# Patient Record
Sex: Female | Born: 1955 | Race: White | Hispanic: No | Marital: Married | Smoking: Never smoker
Health system: Southern US, Community
[De-identification: ages and names within clinical notes are randomized; demographics above are authoritative.]

## PROBLEM LIST (undated history)

## (undated) DIAGNOSIS — M779 Enthesopathy, unspecified: Secondary | ICD-10-CM

## (undated) DIAGNOSIS — G43909 Migraine, unspecified, not intractable, without status migrainosus: Secondary | ICD-10-CM

## (undated) DIAGNOSIS — R42 Dizziness and giddiness: Secondary | ICD-10-CM

## (undated) DIAGNOSIS — D219 Benign neoplasm of connective and other soft tissue, unspecified: Secondary | ICD-10-CM

## (undated) HISTORY — DX: Dizziness and giddiness: R42

## (undated) HISTORY — DX: Benign neoplasm of connective and other soft tissue, unspecified: D21.9

## (undated) HISTORY — DX: Enthesopathy, unspecified: M77.9

## (undated) HISTORY — DX: Migraine, unspecified, not intractable, without status migrainosus: G43.909

## (undated) HISTORY — PX: HYSTEROSCOPY: SHX211

---
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
## 1997-05-26 ENCOUNTER — Other Ambulatory Visit: Admission: RE | Admit: 1997-05-26 | Discharge: 1997-05-26 | Payer: Self-pay | Admitting: Obstetrics and Gynecology

## 1997-09-01 ENCOUNTER — Other Ambulatory Visit: Admission: RE | Admit: 1997-09-01 | Discharge: 1997-09-01 | Payer: Self-pay | Admitting: Obstetrics and Gynecology

## 1997-12-15 ENCOUNTER — Other Ambulatory Visit: Admission: RE | Admit: 1997-12-15 | Discharge: 1997-12-15 | Payer: Self-pay | Admitting: Obstetrics and Gynecology

## 1998-04-09 ENCOUNTER — Other Ambulatory Visit: Admission: RE | Admit: 1998-04-09 | Discharge: 1998-04-09 | Payer: Self-pay | Admitting: Obstetrics and Gynecology

## 1998-10-08 ENCOUNTER — Ambulatory Visit (HOSPITAL_COMMUNITY): Admission: RE | Admit: 1998-10-08 | Discharge: 1998-10-08 | Payer: Self-pay | Admitting: Obstetrics and Gynecology

## 1998-10-08 ENCOUNTER — Encounter: Payer: Self-pay | Admitting: Obstetrics and Gynecology

## 1999-02-25 ENCOUNTER — Ambulatory Visit (HOSPITAL_COMMUNITY): Admission: RE | Admit: 1999-02-25 | Discharge: 1999-02-25 | Payer: Self-pay | Admitting: Obstetrics and Gynecology

## 1999-02-25 ENCOUNTER — Encounter (INDEPENDENT_AMBULATORY_CARE_PROVIDER_SITE_OTHER): Payer: Self-pay | Admitting: Specialist

## 1999-05-18 ENCOUNTER — Other Ambulatory Visit: Admission: RE | Admit: 1999-05-18 | Discharge: 1999-05-18 | Payer: Self-pay | Admitting: Obstetrics and Gynecology

## 1999-12-09 ENCOUNTER — Ambulatory Visit (HOSPITAL_COMMUNITY): Admission: RE | Admit: 1999-12-09 | Discharge: 1999-12-09 | Payer: Self-pay | Admitting: Obstetrics and Gynecology

## 1999-12-09 ENCOUNTER — Encounter: Payer: Self-pay | Admitting: Obstetrics and Gynecology

## 2000-05-23 ENCOUNTER — Other Ambulatory Visit: Admission: RE | Admit: 2000-05-23 | Discharge: 2000-05-23 | Payer: Self-pay | Admitting: Obstetrics and Gynecology

## 2001-05-23 ENCOUNTER — Other Ambulatory Visit: Admission: RE | Admit: 2001-05-23 | Discharge: 2001-05-23 | Payer: Self-pay | Admitting: Obstetrics and Gynecology

## 2002-04-30 ENCOUNTER — Ambulatory Visit (HOSPITAL_COMMUNITY): Admission: RE | Admit: 2002-04-30 | Discharge: 2002-04-30 | Payer: Self-pay | Admitting: Obstetrics and Gynecology

## 2002-04-30 ENCOUNTER — Encounter: Payer: Self-pay | Admitting: Obstetrics and Gynecology

## 2002-06-03 ENCOUNTER — Other Ambulatory Visit: Admission: RE | Admit: 2002-06-03 | Discharge: 2002-06-03 | Payer: Self-pay | Admitting: Obstetrics and Gynecology

## 2003-02-15 HISTORY — PX: ENDOMETRIAL ABLATION: SHX621

## 2003-05-02 ENCOUNTER — Ambulatory Visit (HOSPITAL_COMMUNITY): Admission: RE | Admit: 2003-05-02 | Discharge: 2003-05-02 | Payer: Self-pay | Admitting: Obstetrics and Gynecology

## 2003-06-04 ENCOUNTER — Other Ambulatory Visit: Admission: RE | Admit: 2003-06-04 | Discharge: 2003-06-04 | Payer: Self-pay | Admitting: Obstetrics and Gynecology

## 2003-08-05 ENCOUNTER — Ambulatory Visit (HOSPITAL_COMMUNITY): Admission: RE | Admit: 2003-08-05 | Discharge: 2003-08-05 | Payer: Self-pay | Admitting: Obstetrics and Gynecology

## 2003-08-05 ENCOUNTER — Ambulatory Visit (HOSPITAL_BASED_OUTPATIENT_CLINIC_OR_DEPARTMENT_OTHER): Admission: RE | Admit: 2003-08-05 | Discharge: 2003-08-05 | Payer: Self-pay | Admitting: Obstetrics and Gynecology

## 2003-08-05 ENCOUNTER — Encounter (INDEPENDENT_AMBULATORY_CARE_PROVIDER_SITE_OTHER): Payer: Self-pay | Admitting: Specialist

## 2004-05-05 ENCOUNTER — Ambulatory Visit (HOSPITAL_COMMUNITY): Admission: RE | Admit: 2004-05-05 | Discharge: 2004-05-05 | Payer: Self-pay | Admitting: Obstetrics and Gynecology

## 2004-06-08 ENCOUNTER — Other Ambulatory Visit: Admission: RE | Admit: 2004-06-08 | Discharge: 2004-06-08 | Payer: Self-pay | Admitting: Obstetrics and Gynecology

## 2005-05-06 ENCOUNTER — Ambulatory Visit (HOSPITAL_COMMUNITY): Admission: RE | Admit: 2005-05-06 | Discharge: 2005-05-06 | Payer: Self-pay | Admitting: Obstetrics and Gynecology

## 2005-06-09 ENCOUNTER — Other Ambulatory Visit: Admission: RE | Admit: 2005-06-09 | Discharge: 2005-06-09 | Payer: Self-pay | Admitting: Obstetrics and Gynecology

## 2006-02-24 ENCOUNTER — Encounter: Admission: RE | Admit: 2006-02-24 | Discharge: 2006-02-24 | Payer: Self-pay | Admitting: Obstetrics and Gynecology

## 2006-05-09 ENCOUNTER — Encounter: Admission: RE | Admit: 2006-05-09 | Discharge: 2006-05-09 | Payer: Self-pay | Admitting: Obstetrics and Gynecology

## 2006-07-14 ENCOUNTER — Other Ambulatory Visit: Admission: RE | Admit: 2006-07-14 | Discharge: 2006-07-14 | Payer: Self-pay | Admitting: Obstetrics and Gynecology

## 2007-01-04 ENCOUNTER — Encounter: Admission: RE | Admit: 2007-01-04 | Discharge: 2007-01-15 | Payer: Self-pay | Admitting: Neurology

## 2007-05-11 ENCOUNTER — Encounter: Admission: RE | Admit: 2007-05-11 | Discharge: 2007-05-11 | Payer: Self-pay | Admitting: Obstetrics and Gynecology

## 2007-08-24 ENCOUNTER — Other Ambulatory Visit: Admission: RE | Admit: 2007-08-24 | Discharge: 2007-08-24 | Payer: Self-pay | Admitting: Obstetrics and Gynecology

## 2008-06-16 ENCOUNTER — Ambulatory Visit (HOSPITAL_COMMUNITY): Admission: RE | Admit: 2008-06-16 | Discharge: 2008-06-16 | Payer: Self-pay | Admitting: Obstetrics and Gynecology

## 2008-06-16 ENCOUNTER — Ambulatory Visit: Payer: Self-pay | Admitting: Obstetrics and Gynecology

## 2008-09-29 ENCOUNTER — Encounter: Payer: Self-pay | Admitting: Obstetrics and Gynecology

## 2008-09-29 ENCOUNTER — Ambulatory Visit: Payer: Self-pay | Admitting: Obstetrics and Gynecology

## 2008-09-29 ENCOUNTER — Other Ambulatory Visit: Admission: RE | Admit: 2008-09-29 | Discharge: 2008-09-29 | Payer: Self-pay | Admitting: Obstetrics and Gynecology

## 2009-09-14 ENCOUNTER — Encounter (INDEPENDENT_AMBULATORY_CARE_PROVIDER_SITE_OTHER): Payer: Self-pay | Admitting: *Deleted

## 2009-09-14 ENCOUNTER — Ambulatory Visit (HOSPITAL_COMMUNITY): Admission: RE | Admit: 2009-09-14 | Discharge: 2009-09-14 | Payer: Self-pay | Admitting: Obstetrics and Gynecology

## 2009-10-07 ENCOUNTER — Encounter (INDEPENDENT_AMBULATORY_CARE_PROVIDER_SITE_OTHER): Payer: Self-pay | Admitting: *Deleted

## 2009-10-12 ENCOUNTER — Ambulatory Visit: Payer: Self-pay | Admitting: Gastroenterology

## 2009-10-12 ENCOUNTER — Ambulatory Visit: Payer: Self-pay | Admitting: Obstetrics and Gynecology

## 2009-10-12 ENCOUNTER — Other Ambulatory Visit: Admission: RE | Admit: 2009-10-12 | Discharge: 2009-10-12 | Payer: Self-pay | Admitting: Obstetrics and Gynecology

## 2010-03-16 NOTE — Letter (Signed)
Summary: [facility] Instructions  [facility]  [address] [place], [place] [address]   Phone: [phone]  Fax: [phone]       Dorothy Sharp    [birth year]    MRN: [id number]        Procedure Day [name]:  Duanne Limerick  11/02/09     Arrival Time:  8:00AM     Procedure Time:  9:00AM     Location of Procedure:                    _ X_  [facility] (4th Floor)  PREPARATION FOR COLONOSCOPY WITH MOVIPREP   Starting 5 days prior to your procedure 10/28/09 do not eat nuts, seeds, popcorn, corn, beans, peas,  salads, or any raw vegetables.  Do not take any fiber supplements (e.g. Metamucil, Citrucel, and Benefiber).  THE DAY BEFORE YOUR PROCEDURE         DATE: 11/01/09  DAY: SUNDAY  1.  Drink clear liquids the entire day-NO SOLID FOOD  2.  Do not drink anything colored red or purple.  Avoid juices with pulp.  No orange juice.  3.  Drink at least 64 oz. (8 glasses) of fluid/clear liquids during the day to prevent dehydration and help the prep work efficiently.  CLEAR LIQUIDS INCLUDE: Water Jello Ice Popsicles Tea (sugar ok, no milk/cream) Powdered fruit flavored drinks Coffee (sugar ok, no milk/cream) Gatorade Juice: apple, white grape, white cranberry  Lemonade Clear bullion, consomm, broth Carbonated beverages (any kind) Strained chicken noodle soup Hard Candy                             4.  In the morning, mix first dose of MoviPrep solution:    Empty 1 Pouch A and 1 Pouch B into the disposable container    Add lukewarm drinking water to the top line of the container. Mix to dissolve    Refrigerate (mixed solution should be used within 24 hrs)  5.  Begin drinking the prep at 5:00 p.m. The MoviPrep container is divided by 4 marks.   Every 15 minutes drink the solution down to the next mark (approximately 8 oz) until the full liter is complete.   6.  Follow completed prep with 16 oz of clear liquid of your choice (Nothing red or purple).   Continue to drink clear liquids until bedtime.  7.  Before going to bed, mix second dose of MoviPrep solution:    Empty 1 Pouch A and 1 Pouch B into the disposable container    Add lukewarm drinking water to the top line of the container. Mix to dissolve    Refrigerate  THE DAY OF YOUR PROCEDURE      DATE: 11/02/09  DAY: MONDAY  Beginning at 4:00AM (5 hours before procedure):         1. Every 15 minutes, drink the solution down to the next mark (approx 8 oz) until the full liter is complete.  2. Follow completed prep with 16 oz. of clear liquid of your choice.    3. You may drink clear liquids until 7:00AM (2 HOURS BEFORE PROCEDURE).   MEDICATION INSTRUCTIONS  Unless otherwise instructed, you should take regular prescription medications with a small sip of water   as early as possible the morning of your procedure.       OTHER INSTRUCTIONS  You will need a responsible adult at least 53 years  of age to accompany you and drive you home.   This person must remain in the waiting room during your procedure.  Wear loose fitting clothing that is easily removed.  Leave jewelry and other valuables at home.  However, you may wish to bring a book to read or  an iPod/MP3 player to listen to music as you wait for your procedure to start.  Remove all body piercing jewelry and leave at home.  Total time from sign-in until discharge is approximately 2-3 hours.  You should go home directly after your procedure and rest.  You can resume normal activities the  day after your procedure.  The day of your procedure you should not:   Drive   Make legal decisions   Operate machinery   Drink alcohol   Return to work  You will receive specific instructions about eating, activities and medications before you leave.    The above instructions have been reviewed and explained to me by   Wyona Almas RN  October 12, 2009 4:26 PM     I fully understand and can verbalize these  instructions _____________________________ Date _________

## 2010-03-16 NOTE — Miscellaneous (Signed)
Summary: [facility] Previsit/prep  Clinical Lists Changes  Medications: Added new medication of MOVIPREP 100 GM  SOLR (PEG-KCL-NACL-NASULF-NA ASC-C) As per prep instructions. - Signed Rx of MOVIPREP 100 GM  SOLR (PEG-KCL-NACL-NASULF-NA ASC-C) As per prep instructions.;  #1 x 0;  Signed;  Entered by: Wyona Almas RN;  Authorized by: Mardella Layman MD [facility];  Method used: Electronically to [id number] [address] [place], [place]  [address], Ph: [phone] or [phone], Fax: [phone] Allergies: Added new allergy or adverse reaction of * ZITHROMYCIN Observations: Added new observation of NKA: F (10/12/2009 15:55)    Prescriptions: MOVIPREP 100 GM  SOLR (PEG-KCL-NACL-NASULF-NA ASC-C) As per prep instructions.  #1 x 0   Entered by:   Wyona Almas RN   Authorized by:   Mardella Layman MD [facility]   Signed by:   Wyona Almas RN on 10/12/2009   Method used:   Electronically to        [facility] (retail)       [address]       [place], [place]  [address]       Ph: [phone] or [phone]       Fax: [phone]   RxID:   [id number]

## 2010-03-16 NOTE — Letter (Signed)
Summary: Previsit letter  [facility]  [address] [place], [place] [address]   Phone: [phone]  Fax: [phone]       09/14/2009 MRN: [id number]  Dorothy Sharp [address] [place], [place]  [address]  Dear [name],  Welcome to the Gastroenterology Division at [facility].    You are scheduled to see a nurse for your pre-procedure visit on 10-12-2009 at 4:00pm on the 3rd floor at [facility], [address]  We ask that you try to arrive at our office 15 minutes prior to your appointment time to allow for check-in.  Your nurse visit will consist of discussing your medical and surgical history, your immediate family medical history, and your medications.    Please bring a complete list of all your medications or, if you prefer, bring the medication bottles and we will list them.  We will need to be aware of both prescribed and over the counter drugs.  We will need to know exact dosage information as well.  If you are on blood thinners (Coumadin, Plavix, Aggrenox, Ticlid, etc.) please call our office today/prior to your appointment, as we need to consult with your physician about holding your medication.   Please be prepared to read and sign documents such as consent forms, a financial agreement, and acknowledgement forms.  If necessary, and with your consent, a friend or relative is welcome to sit-in on the nurse visit with you.  Please bring your insurance card so that we may make a copy of it.  If your insurance requires a referral to see a specialist, please bring your referral form from your primary care physician.  No co-pay is required for this nurse visit.     If you cannot keep your appointment, please call [phone] to cancel or reschedule prior to your appointment date.  This allows [place] the opportunity to schedule an appointment for another patient in need of care.    Thank you for choosing [facility] for your medical  needs.  We appreciate the opportunity to care for you.  Please visit [place] at our website  to learn more about our practice.                     Sincerely.                                                                                                                   The Gastroenterology Division

## 2010-07-02 NOTE — Op Note (Signed)
NAME:  Dorothy Sharp, Dorothy Sharp                        ACCOUNT NO.:  [id number]   MEDICAL RECORD NO.:  [id number]                   PATIENT TYPE:  AMB   LOCATION:  [facility]                                 FACILITY:  [facility]   PHYSICIAN:  Daniel L. Eda Paschal, M.D.           DATE OF BIRTH:  [birth year]   DATE OF PROCEDURE:  08/05/2003  DATE OF DISCHARGE:                                 OPERATIVE REPORT   PREOPERATIVE DIAGNOSES:  Menometrorrhagia with submucous myoma.   POSTOPERATIVE DIAGNOSES:  Menometrorrhagia with submucous myoma.   OPERATION:  Hysteroscopic myomectomy followed by endometrial ablation.   SURGEON:  Daniel L. Eda Paschal, M.D.   ANESTHESIA:  General.   INDICATIONS FOR PROCEDURE:  The patient is a 55 year old female who  presented to the office with a six month history of menometrorrhagia.  Saline infusion hysterogram was done in the office which revealed a  submucous myoma of approximately 3 cm. The patient was pretreated with Depot-  Lupron and now enters the hospital for hysteroscopic removal of the myoma  followed by endometrial ablation assuming all goes well with the first part  of the procedure.   FINDINGS:  External and vaginal exam were normal.  Cervix is clean, uterus  is top normal size and shape, adnexa are palpably normal. At the time of  hysteroscopy, the patient had a large myoma coming off the right wall. It  appeared to be a little bit smaller than it had been prior to her Depot-  Lupron.  It did go into the wall but only to a small depth and therefore was  very amenable to resection hysteroscopically.  Other than this, top of the  fundus, tubal ostia, anterior and posterior walls of the fundus, lower  uterine segment, endocervical canal were free of disease.   DESCRIPTION OF PROCEDURE:  After adequate general anesthesia, the patient  was placed in the dorsal lithotomy position, prepped and draped in the usual  sterile manner. She was given 1 g of Cefotan IV.   Her laminaria tent which  had been placed yesterday afternoon was removed, her cervix was well  dilated. It was gently dilated to a 31 Pratt dilator although it required  almost no pressure to do so and then a hysteroscopic resectoscope was  introduced with a single wire loop. Settings were 70 coag, 110 cutting. The  myoma could be completed resected without any really significant bleeding  prior to removing it. The edges of the myoma where the blood supply was were  coagulated to decrease bleeding.  It was necessary to go into the myometrium  to get the remainder of the fibroid but this could be done without too much  difficulty as the myoma started to bulge into the cavity as it was resected.  Once it had been resected, the patient was stable so it was elected to  continue with an endometrial ablation. Initially the endometrium was removed  with a wire  loop 360 degrees around and following that the instrumentation  was switched to a roller ball set at 160 cut, 70 coag pure cut and once  again the roller ball was used to continue the  endometrial ablation. When the procedure was terminated, it appeared that  the entire cavity had been well ablated, fluid deficit was 350 mL, blood  loss was minimal.  The patient tolerated the procedure well and left the  operating room in satisfactory condition.  The patient had pictures taken  for documentation.                                               Daniel L. Eda Paschal, M.D.    Tonette Bihari  D:  08/05/2003  T:  08/05/2003  Job:  [id number]

## 2010-09-20 ENCOUNTER — Other Ambulatory Visit: Payer: Self-pay | Admitting: Obstetrics and Gynecology

## 2010-09-20 DIAGNOSIS — Z1231 Encounter for screening mammogram for malignant neoplasm of breast: Secondary | ICD-10-CM

## 2010-09-21 ENCOUNTER — Telehealth: Payer: Self-pay

## 2010-09-21 NOTE — Telephone Encounter (Signed)
LEFT DETAILED MESSAGE PER DR. GOTTSEGENS NOTE BELOW. TOLD PT TO LEAVE ME A MESSAGE IN MY VOICEMAIL WHAT SHE NEEDS TO DO.

## 2010-09-21 NOTE — Telephone Encounter (Signed)
If she wants to it's fine. However she's had 2 normal ones  the last 2 years I think we could skip this year and lesser diabetes change.

## 2010-09-21 NOTE — Telephone Encounter (Signed)
HAS AEX WITH YOU 10/25/10 A.M. ASKING IF SHE NEEDS TO DO FASTING CHOL. THAT A.M?

## 2010-09-22 NOTE — Telephone Encounter (Signed)
PT. CALLED BACK AND STATES SHE HAS NO NEED FOR FLP THIS YEAR. SHE IS OK WITH DR. GOTTSEGENS RECOMMENDATION BELOW.

## 2010-09-27 ENCOUNTER — Ambulatory Visit (HOSPITAL_COMMUNITY)
Admission: RE | Admit: 2010-09-27 | Discharge: 2010-09-27 | Disposition: A | Discharge status: Home / Self Care | Payer: BC Managed Care – PPO | Source: Ambulatory Visit | Attending: Obstetrics and Gynecology | Admitting: Obstetrics and Gynecology

## 2010-09-27 DIAGNOSIS — Z1231 Encounter for screening mammogram for malignant neoplasm of breast: Secondary | ICD-10-CM | POA: Insufficient documentation

## 2010-10-06 ENCOUNTER — Other Ambulatory Visit: Payer: Self-pay | Admitting: Obstetrics and Gynecology

## 2010-10-14 DIAGNOSIS — D219 Benign neoplasm of connective and other soft tissue, unspecified: Secondary | ICD-10-CM | POA: Insufficient documentation

## 2010-10-14 DIAGNOSIS — G43909 Migraine, unspecified, not intractable, without status migrainosus: Secondary | ICD-10-CM | POA: Insufficient documentation

## 2010-10-23 ENCOUNTER — Other Ambulatory Visit: Payer: Self-pay | Admitting: Obstetrics and Gynecology

## 2010-10-25 ENCOUNTER — Encounter: Payer: Self-pay | Admitting: Obstetrics and Gynecology

## 2010-10-25 ENCOUNTER — Other Ambulatory Visit (HOSPITAL_COMMUNITY)
Admission: RE | Admit: 2010-10-25 | Discharge: 2010-10-25 | Disposition: A | Discharge status: Home / Self Care | Payer: BC Managed Care – PPO | Source: Ambulatory Visit | Attending: Obstetrics and Gynecology | Admitting: Obstetrics and Gynecology

## 2010-10-25 ENCOUNTER — Ambulatory Visit (INDEPENDENT_AMBULATORY_CARE_PROVIDER_SITE_OTHER): Payer: BC Managed Care – PPO | Admitting: Obstetrics and Gynecology

## 2010-10-25 VITALS — BP 120/76 | Ht 65.0 in | Wt 133.0 lb

## 2010-10-25 DIAGNOSIS — Z01419 Encounter for gynecological examination (general) (routine) without abnormal findings: Secondary | ICD-10-CM | POA: Insufficient documentation

## 2010-10-25 DIAGNOSIS — R82998 Other abnormal findings in urine: Secondary | ICD-10-CM

## 2010-10-25 DIAGNOSIS — N938 Other specified abnormal uterine and vaginal bleeding: Secondary | ICD-10-CM

## 2010-10-25 DIAGNOSIS — N949 Unspecified condition associated with female genital organs and menstrual cycle: Secondary | ICD-10-CM

## 2010-10-25 MED ORDER — ETHYNODIOL DIAC-ETH ESTRADIOL 1-35 MG-MCG PO TABS: 1.0000 | ORAL_TABLET | Freq: Every day | ORAL | Status: DC

## 2010-10-25 NOTE — Progress Notes (Signed)
Addended byCammie Mcgee T on: 10/25/2010 10:57 AM   Modules accepted: Orders

## 2010-10-25 NOTE — Progress Notes (Signed)
Patient came to see me today for her annual GYN exam. She does Zovia 135 on a continuous basis. Occasionally because of bleeding she'll stop it. When she stops that she takes Premarin 1.25 mg to prevent menstrual migraines. She's had a colonoscopy which was normal. She is up-to-date on mammograms.  Physical examination: HEENT within normal limits. Neck: Thyroid not large. No masses. Supraclavicular nodes: not enlarged. Breasts: Examined in both sitting midline position. No skin changes and no masses. Abdomen: Soft no guarding rebound or masses or hernia. Pelvic: External: Within normal limits. BUS: Within normal limits. Vaginal:within normal limits. Good estrogen effect. No evidence of cystocele rectocele or enterocele. Cervix: clean. Uterus: Normal size and shape. Adnexa: No masses. Rectovaginal exam: Confirmatory and negative. Extremities: Within normal limits.  Assessment: Menstrual migraines  Plan: Continue Zovia 135 on a continuous basis. Use Premarin when she stops it for bleeding.

## 2010-10-25 NOTE — Telephone Encounter (Signed)
PT DID NOT ASK YOU FOR REFILLS ON THIS TODAY WHEN SHE SAW BUT IT WAS IN ESCRIPTS. DO YOU WANT TO REFILL IT FOR HER ALSO?

## 2010-12-23 ENCOUNTER — Other Ambulatory Visit: Payer: Self-pay | Admitting: Obstetrics and Gynecology

## 2011-02-26 ENCOUNTER — Other Ambulatory Visit: Payer: Self-pay | Admitting: Obstetrics and Gynecology

## 2011-06-17 ENCOUNTER — Other Ambulatory Visit: Payer: Self-pay | Admitting: Obstetrics and Gynecology

## 2011-06-25 ENCOUNTER — Other Ambulatory Visit: Payer: Self-pay | Admitting: Obstetrics and Gynecology

## 2011-09-12 ENCOUNTER — Other Ambulatory Visit: Payer: Self-pay | Admitting: Obstetrics and Gynecology

## 2011-09-12 DIAGNOSIS — Z1231 Encounter for screening mammogram for malignant neoplasm of breast: Secondary | ICD-10-CM

## 2011-10-03 ENCOUNTER — Ambulatory Visit (HOSPITAL_COMMUNITY)
Admission: RE | Admit: 2011-10-03 | Discharge: 2011-10-03 | Disposition: A | Discharge status: Home / Self Care | Payer: BC Managed Care – PPO | Source: Ambulatory Visit | Attending: Obstetrics and Gynecology | Admitting: Obstetrics and Gynecology

## 2011-10-03 DIAGNOSIS — Z1231 Encounter for screening mammogram for malignant neoplasm of breast: Secondary | ICD-10-CM | POA: Insufficient documentation

## 2011-10-31 ENCOUNTER — Encounter: Payer: Self-pay | Admitting: Obstetrics and Gynecology

## 2011-10-31 ENCOUNTER — Ambulatory Visit (INDEPENDENT_AMBULATORY_CARE_PROVIDER_SITE_OTHER): Payer: BC Managed Care – PPO | Admitting: Obstetrics and Gynecology

## 2011-10-31 VITALS — BP 120/72 | Ht 65.25 in | Wt 135.0 lb

## 2011-10-31 DIAGNOSIS — M545 Low back pain, unspecified: Secondary | ICD-10-CM

## 2011-10-31 DIAGNOSIS — Z01419 Encounter for gynecological examination (general) (routine) without abnormal findings: Secondary | ICD-10-CM

## 2011-10-31 DIAGNOSIS — N949 Unspecified condition associated with female genital organs and menstrual cycle: Secondary | ICD-10-CM

## 2011-10-31 DIAGNOSIS — N938 Other specified abnormal uterine and vaginal bleeding: Secondary | ICD-10-CM

## 2011-10-31 DIAGNOSIS — Z780 Asymptomatic menopausal state: Secondary | ICD-10-CM

## 2011-10-31 MED ORDER — ETHYNODIOL DIAC-ETH ESTRADIOL 1-35 MG-MCG PO TABS: 1.0000 | ORAL_TABLET | Freq: Every day | ORAL | Status: DC

## 2011-10-31 NOTE — Patient Instructions (Addendum)
Price vagifem.

## 2011-10-31 NOTE — Progress Notes (Signed)
Patient came to see me today for her annual GYN exam. She remains on birth control pills. She takes active pills continuously both to prevent bleeding and to prevent migraine headaches. Occasionally she will stop her pills for a short period of time and if she has a headache she will take a Premarin that we provide to control the headaches. She does not do this very often. She did have some breakthrough bleeding in early July and stopped her pills for week and when she restarted  her pills the bleeding stopped. She has been having lower back pain for approximately 8 months. It sometimes is aggravated with lifting. She is having occasional vaginal dryness with itching which causes some dyspareunia. She had a baseline colonoscopy in 2011. She had a baseline bone density which was normal in 2010. She had a normal mammogram in July, 2013. She has always had normal yearly Pap smears. Her last Pap smear was 2012.  Physical examination: Leonard Schwartz present. HEENT within normal limits. Neck: Thyroid not large. No masses. Supraclavicular nodes: not enlarged. Breasts: Examined in both sitting and lying  position. No skin changes and no masses. Abdomen: Soft no guarding rebound or masses or hernia. Pelvic: External: Within normal limits. BUS: Within normal limits. Vaginal:within normal limits. Good estrogen effect. No evidence of cystocele rectocele or enterocele. Cervix: clean. Uterus: Normal size and shape. Adnexa: No masses. Rectovaginal exam: Confirmatory and negative. Extremities: Within normal limits.  Assessment: Normal GYN exam. Mild atrophic vaginitis. Lower back pain.  Plan: Pelvic ultrasound scheduled due to back pain. Patient to do a day 7 placebo FSH. Other than that continue birth control pills continuously. Discussed Vagifem. Patient will  Inform  if interested. The new Pap smear guidelines were discussed with the patient. No pap done.

## 2011-11-01 LAB — URINALYSIS W MICROSCOPIC + REFLEX CULTURE
Bacteria, UA: NONE SEEN
Crystals: NONE SEEN
Nitrite: NEGATIVE
Specific Gravity, Urine: 1.01 (ref 1.005–1.030)
pH: 5.5 (ref 5.0–8.0)

## 2011-11-03 ENCOUNTER — Other Ambulatory Visit: Payer: Self-pay | Admitting: Obstetrics and Gynecology

## 2011-11-07 ENCOUNTER — Ambulatory Visit (INDEPENDENT_AMBULATORY_CARE_PROVIDER_SITE_OTHER): Payer: BC Managed Care – PPO

## 2011-11-07 ENCOUNTER — Other Ambulatory Visit: Payer: BC Managed Care – PPO

## 2011-11-07 DIAGNOSIS — D252 Subserosal leiomyoma of uterus: Secondary | ICD-10-CM

## 2011-11-07 DIAGNOSIS — M545 Low back pain, unspecified: Secondary | ICD-10-CM

## 2011-11-07 DIAGNOSIS — N83339 Acquired atrophy of ovary and fallopian tube, unspecified side: Secondary | ICD-10-CM

## 2011-11-07 DIAGNOSIS — D259 Leiomyoma of uterus, unspecified: Secondary | ICD-10-CM

## 2011-11-08 ENCOUNTER — Ambulatory Visit (INDEPENDENT_AMBULATORY_CARE_PROVIDER_SITE_OTHER): Payer: BC Managed Care – PPO | Admitting: Obstetrics and Gynecology

## 2011-11-08 DIAGNOSIS — D259 Leiomyoma of uterus, unspecified: Secondary | ICD-10-CM

## 2011-11-08 NOTE — Progress Notes (Signed)
Patient came in yesterday and had an ultrasound because of back pain. On ultrasound her uterus is enlarged by a posterior wall fibroid of 5 cm. Her endometrial echo is 1.9 mm. Her right ovary is normal. Her left ovary cannot be seen. Her cul-de-sac is free of fluid. The patient has had a previous fibroid but it is definitely enlarged since her previous ultrasounds of 7 years ago. We discussed today the above. For the moment the patient does not feel the back discomfort and warrants surgery. I told her that before we would do surgery she should get an orthopedic opinion regarding the back pain. She will return on a placebo week for an [facility].

## 2011-12-05 ENCOUNTER — Other Ambulatory Visit: Payer: BC Managed Care – PPO

## 2011-12-05 DIAGNOSIS — Z780 Asymptomatic menopausal state: Secondary | ICD-10-CM

## 2011-12-05 LAB — FOLLICLE STIMULATING HORMONE: FSH: 16.6 m[IU]/mL

## 2011-12-29 ENCOUNTER — Other Ambulatory Visit: Payer: Self-pay | Admitting: Obstetrics and Gynecology

## 2012-07-12 ENCOUNTER — Other Ambulatory Visit: Payer: Self-pay

## 2012-07-12 MED ORDER — LEVOCETIRIZINE DIHYDROCHLORIDE 5 MG PO TABS: ORAL_TABLET | ORAL | Status: DC

## 2012-07-12 NOTE — Telephone Encounter (Signed)
Former patient of Dr. Timoteo Expose.  Current with [facility]. Not due til Sept 2014. Uses this for prevent allergy symptoms.

## 2012-09-03 ENCOUNTER — Other Ambulatory Visit: Payer: Self-pay | Admitting: Gynecology

## 2012-09-03 DIAGNOSIS — Z1231 Encounter for screening mammogram for malignant neoplasm of breast: Secondary | ICD-10-CM

## 2012-10-08 ENCOUNTER — Ambulatory Visit (HOSPITAL_COMMUNITY)
Admission: RE | Admit: 2012-10-08 | Discharge: 2012-10-08 | Disposition: A | Discharge status: Home / Self Care | Payer: BC Managed Care – PPO | Source: Ambulatory Visit | Attending: Gynecology | Admitting: Gynecology

## 2012-10-08 DIAGNOSIS — Z1231 Encounter for screening mammogram for malignant neoplasm of breast: Secondary | ICD-10-CM | POA: Insufficient documentation

## 2012-11-05 ENCOUNTER — Encounter: Payer: Self-pay | Admitting: Gynecology

## 2012-11-12 ENCOUNTER — Encounter: Payer: Self-pay | Admitting: Gynecology

## 2012-11-12 ENCOUNTER — Ambulatory Visit (INDEPENDENT_AMBULATORY_CARE_PROVIDER_SITE_OTHER): Payer: BC Managed Care – PPO | Admitting: Gynecology

## 2012-11-12 VITALS — BP 112/64 | Ht 65.5 in | Wt 133.0 lb

## 2012-11-12 DIAGNOSIS — Z309 Encounter for contraceptive management, unspecified: Secondary | ICD-10-CM

## 2012-11-12 DIAGNOSIS — D251 Intramural leiomyoma of uterus: Secondary | ICD-10-CM

## 2012-11-12 DIAGNOSIS — G43909 Migraine, unspecified, not intractable, without status migrainosus: Secondary | ICD-10-CM

## 2012-11-12 DIAGNOSIS — Z13220 Encounter for screening for lipoid disorders: Secondary | ICD-10-CM

## 2012-11-12 DIAGNOSIS — Z01419 Encounter for gynecological examination (general) (routine) without abnormal findings: Secondary | ICD-10-CM

## 2012-11-12 NOTE — Progress Notes (Signed)
Dorothy Sharp [birth year] [id number]        57 y.o.  [id number] for annual exam.  Former patient Dr. Eda Paschal. Several issues noted below.  Past medical history,surgical history, medications, allergies, family history and social history were all reviewed and documented in the EPIC chart.  ROS:  Performed and pertinent positives and negatives are included in the history, assessment and plan .  Exam: Kim assistant Filed Vitals:   11/12/12 0951  BP: 112/64  Height: 5' 5.5" (1.664 m)  Weight: 133 lb (60.328 kg)   General appearance  Normal Skin grossly normal Head/Neck normal with no cervical or supraclavicular adenopathy thyroid normal Lungs  clear Cardiac RR, without RMG Abdominal  soft, nontender, without masses, organomegaly or hernia Breasts  examined lying and sitting without masses, retractions, discharge or axillary adenopathy. Pelvic  Ext/BUS/vagina  normal with mild atrophic changes  Cervix  normal  Uterus  anteverted mild irregular shape, normal size, midline and mobile nontender   Adnexa  Without masses or tenderness    Anus and perineum  normal   Rectovaginal  normal sphincter tone without palpated masses or tenderness.    Assessment/Plan:  57 y.o. [id number] female for annual exam.   1. BCPs. Patient on 35 mcg pill. Occasional menses, is status post endometrial ablation. Had pill free week FSH last year 16.6. Uses Premarin during the pill free week occasionally when she has a migraine which seems to help. Options for management were reviewed to include FSH during her pill free week and continue on the pill at present. Switch to a lower dose estrogen pill such as LoLoEstrin to possibly decrease the risk of thrombosis, stop the pill altogether keep menstrual calendar and symptom log and use backup contraception. After lengthy discussion to include the risks of birth control pills stroke heart attack DVT possibly increased with advancing age. Does not smoke is not being followed  for any medical issues. Possible increased risk of stroke with migraine headaches also discussed.  Patient decides she wants to stop the pills, keep a menstrual and symptom calendar, use backup contraception and then see how she's doing. Also asked her to return in several weeks after stopping the pills for an [facility]. Will also do her baseline labs at that time as she has eaten today. 2. Migraine headaches. If her migraine headaches would continue I recommended followup with neurology for management and she agrees to arrange. 3. Leiomyoma. Patient had ultrasound last year which showed 5 cm myoma. She is asymptomatic from this and exam confirms mild irregularity but no enlargement. Continue to monitor with annual exams. 4. Mammography 09/2012. Continue with annual mammography. 5. Pap smear 2012. No Pap smear done today. No history of abnormal Pap smears. Plan repeat Pap smear exterior 3 year interval. 6. Colonoscopy 2011. Repeat at their recommended interval. 7. DEXA 2010 normal. Recommend repeat at age 42 as she is without significant family history and currently on estrogen. Increase calcium vitamin D discussed. We'll check a spine vitamin D level with her blood draw. 8. Health maintenance. Baseline CBC comprehensive metabolic panel lipid profile urinalysis TSH vitamin D and FSH ordered as a future order and patient will come back fasting.  Note: This document was prepared with digital dictation and possible smart phrase technology. Any transcriptional errors that result from this process are unintentional.   Dara Lords MD, 10:33 AM 11/12/2012

## 2012-11-12 NOTE — Patient Instructions (Signed)
Return for lab work in several weeks. Call if any questions or issues. Use backup contraception as we discussed.

## 2012-11-14 ENCOUNTER — Other Ambulatory Visit: Payer: Self-pay | Admitting: Obstetrics and Gynecology

## 2012-11-19 ENCOUNTER — Telehealth: Payer: Self-pay | Admitting: *Deleted

## 2012-11-19 NOTE — Telephone Encounter (Signed)
Pt informed with the below # given for Dr.Lewit office.

## 2012-11-19 NOTE — Telephone Encounter (Signed)
Pt called back after told the below requesting if low dose birth control pill could be called in? Pt said she is not a smoker, has not had any issues with elevated blood pressure. She has migraines when not taking birth control pills, unable to get in with Dr.Love now. Please advise

## 2012-11-19 NOTE — Telephone Encounter (Signed)
No, birth control pills in patients with migraines have a higher risk of stroke. Along with her age, I do not think this would be a wise choice. She really needs to get in to see a neurologist. If Dr. Imagene Gurney office is difficult then I would suggest Dr. Amelia Jo

## 2012-11-19 NOTE — Telephone Encounter (Signed)
Pt informed with the below note. 

## 2012-11-19 NOTE — Telephone Encounter (Signed)
Pt called c/o severe migraine headache that started on Friday. Pt used ibuprofen over the weekend now having right side head pain in eyes are throbbing. Pt did put a call in to [facility] office and waiting for return call. Pt had old Rx for imtrex spray 20 mg that she used, pt asked if you would be willing to give reffill Rx for this. Please advise

## 2012-11-19 NOTE — Telephone Encounter (Signed)
Needs to be followed up by Dr. Love/neurology. Do not think gynecologist the best choice to treat a severe migraine when she is already under their care.

## 2012-11-23 ENCOUNTER — Other Ambulatory Visit: Payer: Self-pay | Admitting: Gynecology

## 2012-12-03 ENCOUNTER — Ambulatory Visit: Payer: BC Managed Care – PPO

## 2012-12-03 DIAGNOSIS — Z13220 Encounter for screening for lipoid disorders: Secondary | ICD-10-CM

## 2012-12-03 DIAGNOSIS — Z309 Encounter for contraceptive management, unspecified: Secondary | ICD-10-CM

## 2012-12-03 DIAGNOSIS — Z01419 Encounter for gynecological examination (general) (routine) without abnormal findings: Secondary | ICD-10-CM

## 2012-12-03 LAB — LIPID PANEL
Cholesterol: 240 mg/dL — ABNORMAL HIGH (ref 0–200)
LDL Cholesterol: 131 mg/dL — ABNORMAL HIGH (ref 0–99)
Total CHOL/HDL Ratio: 2.4 Ratio
VLDL: 11 mg/dL (ref 0–40)

## 2012-12-03 LAB — CBC WITH DIFFERENTIAL/PLATELET
Basophils Absolute: 0 10*3/uL (ref 0.0–0.1)
Basophils Relative: 1 % (ref 0–1)
Eosinophils Absolute: 0.1 10*3/uL (ref 0.0–0.7)
Eosinophils Relative: 1 % (ref 0–5)
Lymphs Abs: 1.9 10*3/uL (ref 0.7–4.0)
MCH: 32.2 pg (ref 26.0–34.0)
MCV: 91.5 fL (ref 78.0–100.0)
Neutrophils Relative %: 59 % (ref 43–77)
Platelets: 374 10*3/uL (ref 150–400)
RBC: 4.13 MIL/uL (ref 3.87–5.11)
RDW: 12.7 % (ref 11.5–15.5)
WBC: 6.4 10*3/uL (ref 4.0–10.5)

## 2012-12-03 LAB — COMPREHENSIVE METABOLIC PANEL
ALT: 36 U/L — ABNORMAL HIGH (ref 0–35)
AST: 31 U/L (ref 0–37)
Alkaline Phosphatase: 42 U/L (ref 39–117)
CO2: 27 mEq/L (ref 19–32)
Creat: 0.81 mg/dL (ref 0.50–1.10)
Sodium: 138 mEq/L (ref 135–145)
Total Bilirubin: 1.2 mg/dL (ref 0.3–1.2)
Total Protein: 6.7 g/dL (ref 6.0–8.3)

## 2012-12-03 LAB — TSH: TSH: 0.855 u[IU]/mL (ref 0.350–4.500)

## 2012-12-04 LAB — VITAMIN D 25 HYDROXY (VIT D DEFICIENCY, FRACTURES): Vit D, 25-Hydroxy: 69 ng/mL (ref 30–89)

## 2012-12-04 LAB — URINALYSIS W MICROSCOPIC + REFLEX CULTURE
Bilirubin Urine: NEGATIVE
Crystals: NONE SEEN
Nitrite: NEGATIVE
Protein, ur: NEGATIVE mg/dL
Specific Gravity, Urine: 1.006 (ref 1.005–1.030)
Squamous Epithelial / LPF: NONE SEEN
Urobilinogen, UA: 0.2 mg/dL (ref 0.0–1.0)

## 2012-12-05 ENCOUNTER — Other Ambulatory Visit: Payer: Self-pay | Admitting: Gynecology

## 2012-12-05 ENCOUNTER — Encounter: Payer: Self-pay | Admitting: Gynecology

## 2012-12-05 LAB — URINE CULTURE: Colony Count: >=100000

## 2012-12-05 MED ORDER — AMPICILLIN 500 MG PO CAPS: 500.0000 mg | ORAL_CAPSULE | Freq: Four times a day (QID) | ORAL | Status: DC

## 2013-04-15 ENCOUNTER — Other Ambulatory Visit: Payer: Self-pay | Admitting: Gynecology

## 2013-07-18 ENCOUNTER — Other Ambulatory Visit: Payer: Self-pay | Admitting: Gynecology

## 2013-07-29 ENCOUNTER — Other Ambulatory Visit: Payer: Self-pay

## 2013-07-29 ENCOUNTER — Other Ambulatory Visit: Payer: Self-pay | Admitting: Obstetrics and Gynecology

## 2013-08-01 ENCOUNTER — Other Ambulatory Visit: Payer: Self-pay | Admitting: Gynecology

## 2013-08-05 ENCOUNTER — Telehealth: Payer: Self-pay | Admitting: *Deleted

## 2013-08-05 MED ORDER — NONFORMULARY OR COMPOUNDED ITEM: Status: DC

## 2013-08-05 NOTE — Telephone Encounter (Signed)
Okay to refill boric acid suppositories 600 mg #90 one per vagina 2- 3 times weekly refill x3

## 2013-08-05 NOTE — Telephone Encounter (Signed)
Pt is former patient of Dr.Gottsegen requesting a Rx for boric acid suppositories take three times weekly vaginally. Okay to fill?

## 2013-08-05 NOTE — Telephone Encounter (Signed)
rx called in to [facility], pt aware as well

## 2013-09-23 ENCOUNTER — Other Ambulatory Visit: Payer: Self-pay | Admitting: Gynecology

## 2013-09-23 DIAGNOSIS — Z1231 Encounter for screening mammogram for malignant neoplasm of breast: Secondary | ICD-10-CM

## 2013-10-14 ENCOUNTER — Ambulatory Visit (HOSPITAL_COMMUNITY)
Admission: RE | Admit: 2013-10-14 | Discharge: 2013-10-14 | Disposition: A | Discharge status: Home / Self Care | Payer: BC Managed Care – PPO | Source: Ambulatory Visit | Attending: Gynecology | Admitting: Gynecology

## 2013-10-14 DIAGNOSIS — Z1231 Encounter for screening mammogram for malignant neoplasm of breast: Secondary | ICD-10-CM | POA: Diagnosis not present

## 2013-10-15 ENCOUNTER — Other Ambulatory Visit: Payer: Self-pay | Admitting: Gynecology

## 2013-11-25 ENCOUNTER — Encounter: Payer: BC Managed Care – PPO | Admitting: Gynecology

## 2013-12-02 ENCOUNTER — Ambulatory Visit (INDEPENDENT_AMBULATORY_CARE_PROVIDER_SITE_OTHER): Payer: BC Managed Care – PPO | Admitting: Gynecology

## 2013-12-02 ENCOUNTER — Other Ambulatory Visit (HOSPITAL_COMMUNITY)
Admission: RE | Admit: 2013-12-02 | Discharge: 2013-12-02 | Disposition: A | Discharge status: Home / Self Care | Payer: BC Managed Care – PPO | Source: Ambulatory Visit | Attending: Gynecology | Admitting: Gynecology

## 2013-12-02 ENCOUNTER — Encounter: Payer: Self-pay | Admitting: Gynecology

## 2013-12-02 VITALS — BP 120/74 | Ht 65.0 in | Wt 132.0 lb

## 2013-12-02 DIAGNOSIS — D251 Intramural leiomyoma of uterus: Secondary | ICD-10-CM

## 2013-12-02 DIAGNOSIS — Z1151 Encounter for screening for human papillomavirus (HPV): Secondary | ICD-10-CM | POA: Diagnosis present

## 2013-12-02 DIAGNOSIS — Z01419 Encounter for gynecological examination (general) (routine) without abnormal findings: Secondary | ICD-10-CM | POA: Insufficient documentation

## 2013-12-02 DIAGNOSIS — N926 Irregular menstruation, unspecified: Secondary | ICD-10-CM

## 2013-12-02 LAB — FOLLICLE STIMULATING HORMONE: FSH: 24.6 m[IU]/mL

## 2013-12-02 NOTE — Progress Notes (Signed)
LEEZA HEINER December 03, 1955 [id number]        58 y.o.  [id number] for annual exam.  Several issues noted below.  Past medical history,surgical history, problem list, medications, allergies, family history and social history were all reviewed and documented as reviewed in the EPIC chart.  ROS:  12 system ROS performed with pertinent positives and negatives included in the history, assessment and plan.   Additional significant findings :  none   Exam: Kim Counsellor Vitals:   12/02/13 0832  BP: 120/74  Height: 5\' 5"  (1.651 m)  Weight: 132 lb (59.875 kg)   General appearance:  Normal affect, orientation and appearance. Skin: Grossly normal HEENT: Without gross lesions.  No cervical or supraclavicular adenopathy. Thyroid normal.  Lungs:  Clear without wheezing, rales or rhonchi Cardiac: RR, without RMG Abdominal:  Soft, nontender, without masses, guarding, rebound, organomegaly or hernia Breasts:  Examined lying and sitting without masses, retractions, discharge or axillary adenopathy. Pelvic:  Ext/BUS/vagina normal  Cervix normal. Pap/HPV. Slight menses flow.  Uterus anteverted, nodular consistent with history of 5 cm leiomyoma. Nontender.  Adnexa  Without masses or tenderness    Anus and perineum  Normal   Rectovaginal  Normal sphincter tone without palpated masses or tenderness.    Assessment/Plan:  59 y.o. [id number] female for annual exam.   1. Irregular menses/perimenopausal. Patient has discontinued her oral contraceptives. [place] last year 104. Had menses in May and now is starting a menses today.  Not having significant hot flashes or night sweats. Will recheck her [facility] today. We'll continue keeping a menstrual calendar and as long as less frequent but regular menses will follow. If prolonged or atypical bleeding or goes more than one year without menses and then bleeds patient has importance of reporting this. Continue with barrier contraception for now. Patient finds this all  acceptable. 2. Leiomyoma. 5 cm on prior ultrasound by Dr. Cherylann Banas. Feels to be the same on exam today. Patient without significant symptoms. Will continue to monitor with annual exams. 3. Uses boric acid suppositories occasionally for yeast infections. We'll start them at the earliest symptoms 2-3 times weekly. Does not use them very much and has a supply at home. Will call if she needs more. 4. Pap smear 2012.  Pap/HPV today. No history of abnormal Pap smears previously. Plan repeat at 36-56-year-old interval per current screening guidelines. 5. Mammography 09/2013. Continue with annual mammography. SBE monthly reviewed. 6. DEXA 2010 normal. Repeat age 13. Increased vitamin D. And calcium reviewed. 7. Colonoscopy 2011. Repeat at their recommended interval. 8. Health maintenance. No routine blood work done today she has this done through her primary physician's office who is also following her for her cholesterol. Follow up for [facility] results otherwise annually.     Anastasio Auerbach MD, 9:02 AM 12/02/2013

## 2013-12-02 NOTE — Addendum Note (Signed)
Addended by: Nelva Nay on: 12/02/2013 09:36 AM   Modules accepted: Orders

## 2013-12-02 NOTE — Patient Instructions (Signed)
You may obtain a copy of any labs that were done today by logging onto MyChart as outlined in the instructions provided with your AVS (after visit summary). The office will not call with normal lab results but certainly if there are any significant abnormalities then we will contact you.   Health Maintenance, Female A healthy lifestyle and preventative care can promote health and wellness.  Maintain regular health, dental, and eye exams.  Eat a healthy diet. Foods like vegetables, fruits, whole grains, low-fat dairy products, and lean protein foods contain the nutrients you need without too many calories. Decrease your intake of foods high in solid fats, added sugars, and salt. Get information about a proper diet from your caregiver, if necessary.  Regular physical exercise is one of the most important things you can do for your health. Most adults should get at least 150 minutes of moderate-intensity exercise (any activity that increases your heart rate and causes you to sweat) each week. In addition, most adults need muscle-strengthening exercises on 2 or more days a week.   Maintain a healthy weight. The body mass index (BMI) is a screening tool to identify possible weight problems. It provides an estimate of body fat based on height and weight. Your caregiver can help determine your BMI, and can help you achieve or maintain a healthy weight. For adults 20 years and older:  A BMI below 18.5 is considered underweight.  A BMI of 18.5 to 24.9 is normal.  A BMI of 25 to 29.9 is considered overweight.  A BMI of 30 and above is considered obese.  Maintain normal blood lipids and cholesterol by exercising and minimizing your intake of saturated fat. Eat a balanced diet with plenty of fruits and vegetables. Blood tests for lipids and cholesterol should begin at age 61 and be repeated every 5 years. If your lipid or cholesterol levels are high, you are over 50, or you are a high risk for heart  disease, you may need your cholesterol levels checked more frequently.Ongoing high lipid and cholesterol levels should be treated with medicines if diet and exercise are not effective.  If you smoke, find out from your caregiver how to quit. If you do not use tobacco, do not start.  Lung cancer screening is recommended for adults aged 33 80 years who are at high risk for developing lung cancer because of a history of smoking. Yearly low-dose computed tomography (CT) is recommended for people who have at least a 30-pack-year history of smoking and are a current smoker or have quit within the past 15 years. A pack year of smoking is smoking an average of 1 pack of cigarettes a day for 1 year (for example: 1 pack a day for 30 years or 2 packs a day for 15 years). Yearly screening should continue until the smoker has stopped smoking for at least 15 years. Yearly screening should also be stopped for people who develop a health problem that would prevent them from having lung cancer treatment.  If you are pregnant, do not drink alcohol. If you are breastfeeding, be very cautious about drinking alcohol. If you are not pregnant and choose to drink alcohol, do not exceed 1 drink per day. One drink is considered to be 12 ounces (355 mL) of beer, 5 ounces (148 mL) of wine, or 1.5 ounces (44 mL) of liquor.  Avoid use of street drugs. Do not share needles with anyone. Ask for help if you need support or instructions about stopping  the use of drugs.  High blood pressure causes heart disease and increases the risk of stroke. Blood pressure should be checked at least every 1 to 2 years. Ongoing high blood pressure should be treated with medicines, if weight loss and exercise are not effective.  If you are 59 to 56 years old, ask your caregiver if you should take aspirin to prevent strokes.  Diabetes screening involves taking a blood sample to check your fasting blood sugar level. This should be done once every 3  years, after age 91, if you are within normal weight and without risk factors for diabetes. Testing should be considered at a younger age or be carried out more frequently if you are overweight and have at least 1 risk factor for diabetes.  Breast cancer screening is essential preventative care for women. You should practice "breast self-awareness." This means understanding the normal appearance and feel of your breasts and may include breast self-examination. Any changes detected, no matter how small, should be reported to a caregiver. Women in their 66s and 30s should have a clinical breast exam (CBE) by a caregiver as part of a regular health exam every 1 to 3 years. After age 101, women should have a CBE every year. Starting at age 100, women should consider having a mammogram (breast X-ray) every year. Women who have a family history of breast cancer should talk to their caregiver about genetic screening. Women at a high risk of breast cancer should talk to their caregiver about having an MRI and a mammogram every year.  Breast cancer gene (BRCA)-related cancer risk assessment is recommended for women who have family members with BRCA-related cancers. BRCA-related cancers include breast, ovarian, tubal, and peritoneal cancers. Having family members with these cancers may be associated with an increased risk for harmful changes (mutations) in the breast cancer genes BRCA1 and BRCA2. Results of the assessment will determine the need for genetic counseling and BRCA1 and BRCA2 testing.  The Pap test is a screening test for cervical cancer. Women should have a Pap test starting at age 57. Between ages 25 and 35, Pap tests should be repeated every 2 years. Beginning at age 37, you should have a Pap test every 3 years as long as the past 3 Pap tests have been normal. If you had a hysterectomy for a problem that was not cancer or a condition that could lead to cancer, then you no longer need Pap tests. If you are  between ages 50 and 76, and you have had normal Pap tests going back 10 years, you no longer need Pap tests. If you have had past treatment for cervical cancer or a condition that could lead to cancer, you need Pap tests and screening for cancer for at least 20 years after your treatment. If Pap tests have been discontinued, risk factors (such as a new sexual partner) need to be reassessed to determine if screening should be resumed. Some women have medical problems that increase the chance of getting cervical cancer. In these cases, your caregiver may recommend more frequent screening and Pap tests.  The human papillomavirus (HPV) test is an additional test that may be used for cervical cancer screening. The HPV test looks for the virus that can cause the cell changes on the cervix. The cells collected during the Pap test can be tested for HPV. The HPV test could be used to screen women aged 44 years and older, and should be used in women of any age  who have unclear Pap test results. After the age of 55, women should have HPV testing at the same frequency as a Pap test.  Colorectal cancer can be detected and often prevented. Most routine colorectal cancer screening begins at the age of 44 and continues through age 20. However, your caregiver may recommend screening at an earlier age if you have risk factors for colon cancer. On a yearly basis, your caregiver may provide home test kits to check for hidden blood in the stool. Use of a small camera at the end of a tube, to directly examine the colon (sigmoidoscopy or colonoscopy), can detect the earliest forms of colorectal cancer. Talk to your caregiver about this at age 86, when routine screening begins. Direct examination of the colon should be repeated every 5 to 10 years through age 13, unless early forms of pre-cancerous polyps or small growths are found.  Hepatitis C blood testing is recommended for all people born from [birth year] through 1963 and any  individual with known risks for hepatitis C.  Practice safe sex. Use condoms and avoid high-risk sexual practices to reduce the spread of sexually transmitted infections (STIs). Sexually active women aged 36 and younger should be checked for Chlamydia, which is a common sexually transmitted infection. Older women with new or multiple partners should also be tested for Chlamydia. Testing for other STIs is recommended if you are sexually active and at increased risk.  Osteoporosis is a disease in which the bones lose minerals and strength with aging. This can result in serious bone fractures. The risk of osteoporosis can be identified using a bone density scan. Women ages 20 and over and women at risk for fractures or osteoporosis should discuss screening with their caregivers. Ask your caregiver whether you should be taking a calcium supplement or vitamin D to reduce the rate of osteoporosis.  Menopause can be associated with physical symptoms and risks. Hormone replacement therapy is available to decrease symptoms and risks. You should talk to your caregiver about whether hormone replacement therapy is right for you.  Use sunscreen. Apply sunscreen liberally and repeatedly throughout the day. You should seek shade when your shadow is shorter than you. Protect yourself by wearing long sleeves, pants, a wide-brimmed hat, and sunglasses year round, whenever you are outdoors.  Notify your caregiver of new moles or changes in moles, especially if there is a change in shape or color. Also notify your caregiver if a mole is larger than the size of a pencil eraser.  Stay current with your immunizations. Document Released: 08/16/2010 Document Revised: 05/28/2012 Document Reviewed: 08/16/2010 [facility] Patient Information 2014 [employer].

## 2013-12-03 LAB — CYTOLOGY - PAP

## 2013-12-10 ENCOUNTER — Other Ambulatory Visit: Payer: Self-pay | Admitting: Gynecology

## 2013-12-16 ENCOUNTER — Encounter: Payer: Self-pay | Admitting: Gynecology

## 2014-07-22 ENCOUNTER — Other Ambulatory Visit: Payer: Self-pay | Admitting: Gynecology

## 2014-07-22 NOTE — Telephone Encounter (Signed)
Last filled in Oct. 2015 with refills

## 2014-09-15 ENCOUNTER — Other Ambulatory Visit: Payer: Self-pay | Admitting: Gynecology

## 2014-09-15 DIAGNOSIS — Z1231 Encounter for screening mammogram for malignant neoplasm of breast: Secondary | ICD-10-CM

## 2014-10-27 ENCOUNTER — Ambulatory Visit (HOSPITAL_COMMUNITY): Payer: Self-pay

## 2014-11-03 ENCOUNTER — Ambulatory Visit (HOSPITAL_COMMUNITY)
Admission: RE | Admit: 2014-11-03 | Discharge: 2014-11-03 | Disposition: A | Discharge status: Home / Self Care | Payer: BLUE CROSS/BLUE SHIELD | Source: Ambulatory Visit | Attending: Gynecology | Admitting: Gynecology

## 2014-11-03 DIAGNOSIS — Z1231 Encounter for screening mammogram for malignant neoplasm of breast: Secondary | ICD-10-CM | POA: Insufficient documentation

## 2014-12-15 ENCOUNTER — Encounter: Payer: Self-pay | Admitting: Gynecology

## 2014-12-15 ENCOUNTER — Ambulatory Visit (INDEPENDENT_AMBULATORY_CARE_PROVIDER_SITE_OTHER): Payer: BLUE CROSS/BLUE SHIELD | Admitting: Gynecology

## 2014-12-15 VITALS — BP 120/76 | Ht 65.5 in | Wt 132.0 lb

## 2014-12-15 DIAGNOSIS — N952 Postmenopausal atrophic vaginitis: Secondary | ICD-10-CM | POA: Diagnosis not present

## 2014-12-15 DIAGNOSIS — N760 Acute vaginitis: Secondary | ICD-10-CM

## 2014-12-15 DIAGNOSIS — D251 Intramural leiomyoma of uterus: Secondary | ICD-10-CM | POA: Diagnosis not present

## 2014-12-15 DIAGNOSIS — Z01419 Encounter for gynecological examination (general) (routine) without abnormal findings: Secondary | ICD-10-CM

## 2014-12-15 DIAGNOSIS — N951 Menopausal and female climacteric states: Secondary | ICD-10-CM | POA: Diagnosis not present

## 2014-12-15 MED ORDER — FLUCONAZOLE 150 MG PO TABS: 150.0000 mg | ORAL_TABLET | Freq: Once | ORAL | Status: DC

## 2014-12-15 NOTE — Progress Notes (Signed)
  Dorothy Sharp Jul 09, 1955 [id number]        58 y.o.  [id number]  Patient's last menstrual period was 04/18/2014. for annual exam.  Several issues noted below  Past medical history,surgical history, problem list, medications, allergies, family history and social history were all reviewed and documented as reviewed in the EPIC chart.  ROS:  Performed with pertinent positives and negatives included in the history, assessment and plan.   Additional significant findings :  none   Exam: Kim Counsellor Vitals:   12/15/14 0855  BP: 120/76  Height: 5' 5.5" (1.664 m)  Weight: 132 lb (59.875 kg)   General appearance:  Normal affect, orientation and appearance. Skin: Grossly normal HEENT: Without gross lesions.  No cervical or supraclavicular adenopathy. Thyroid normal.  Lungs:  Clear without wheezing, rales or rhonchi Cardiac: RR, without RMG Abdominal:  Soft, nontender, without masses, guarding, rebound, organomegaly or hernia Breasts:  Examined lying and sitting without masses, retractions, discharge or axillary adenopathy. Pelvic:  Ext/BUS/vagina with atrophic changes  Cervix normal  Uterus nodular anteverted consistent with her history of 5 cm leiomyoma. Midline mobile nontender.  Adnexa  Without masses or tenderness    Anus and perineum  Normal   Rectovaginal  Normal sphincter tone without palpated masses or tenderness.    Assessment/Plan:  59 y.o. [id number] female for annual exam LMP March 2016, barrier contraception.   1. Perimenopausal. Last menstrual period March 2016. Without significant hot flushes, night sweats, vaginal dryness. Keep menstrual calendar and report any prolonged or atypical bleeding. It goes more than one year without menses patient knows to report this.  Does have some mild vaginal dryness with intercourse. She will try vaginal lubricants OTC. Alternatives such as vaginal estrogen reviewed. Will follow up if continues to be an issue. 2. Decreased libido. I  reviewed the issues of decreased libido and options with her up to and including testosterone supplementation and Addyi.  Patient not interested in medication at this point. 3. Vaginitis. Uses boric acid suppositories intermittently. Notes some irritation when using this. Options to use Diflucan 150 mg at the earliest symptoms of itching discussed. She does not use it very frequently. She wants to go ahead and try this to avoid the boric acid irritation. Diflucan 150 mg #5 with 1 refill. She did ask for boric acid suppository refill though to have on hand just in case the Diflucan does not work and boric acid 600 mg #30 one refill provided. 4. Leiomyoma. Patient has a 5.5 cm leiomyoma on last ultrasound several years ago. Stable by exam. Options for management include observation, repeat ultrasound, surgery discussed in patients comfortable with just monitoring. She is asymptomatic from this. 5. DEXA 2010 normal. Discussed repeating at age 40. Increased calcium and vitamin D reviewed. 6. Colonoscopy 2011 with reported repeat interval 10 years. 7. Mammography 10/2014. Continue with annual mammography. SBE monthly reviewed. 8. Pap smear/HPV 2015 negative. No Pap smear done today. No history of significant abnormal Pap smears. 9. Health maintenance. No routine lab work done as patient reports this done at her primary physician's office. She brought a copy with her that showed a mildly elevated cholesterol 214 and a mildly elevated LDL 107 with an HDL of 96. They're just following this for now. The remainder of her lab work was normal. Follow up in one year, sooner as needed.   Anastasio Auerbach MD, 9:28 AM 12/15/2014

## 2014-12-15 NOTE — Patient Instructions (Signed)

## 2015-03-09 ENCOUNTER — Other Ambulatory Visit: Payer: Self-pay | Admitting: Gynecology

## 2015-09-04 ENCOUNTER — Other Ambulatory Visit: Payer: Self-pay

## 2015-09-06 MED ORDER — LEVOCETIRIZINE DIHYDROCHLORIDE 5 MG PO TABS: 5.0000 mg | ORAL_TABLET | Freq: Every day | ORAL | 1 refills | Status: DC

## 2015-09-28 ENCOUNTER — Other Ambulatory Visit: Payer: Self-pay | Admitting: Gynecology

## 2015-09-28 DIAGNOSIS — Z1231 Encounter for screening mammogram for malignant neoplasm of breast: Secondary | ICD-10-CM

## 2015-11-09 ENCOUNTER — Ambulatory Visit
Admission: RE | Admit: 2015-11-09 | Discharge: 2015-11-09 | Disposition: A | Discharge status: Home / Self Care | Payer: BLUE CROSS/BLUE SHIELD | Source: Ambulatory Visit | Attending: Gynecology | Admitting: Gynecology

## 2015-11-09 DIAGNOSIS — Z1231 Encounter for screening mammogram for malignant neoplasm of breast: Secondary | ICD-10-CM

## 2015-12-21 ENCOUNTER — Encounter: Payer: Self-pay | Admitting: Gynecology

## 2015-12-21 ENCOUNTER — Ambulatory Visit (INDEPENDENT_AMBULATORY_CARE_PROVIDER_SITE_OTHER): Payer: BLUE CROSS/BLUE SHIELD | Admitting: Gynecology

## 2015-12-21 VITALS — BP 120/74 | Ht 65.5 in | Wt 132.0 lb

## 2015-12-21 DIAGNOSIS — Z01419 Encounter for gynecological examination (general) (routine) without abnormal findings: Secondary | ICD-10-CM | POA: Diagnosis not present

## 2015-12-21 DIAGNOSIS — D251 Intramural leiomyoma of uterus: Secondary | ICD-10-CM

## 2015-12-21 DIAGNOSIS — N952 Postmenopausal atrophic vaginitis: Secondary | ICD-10-CM

## 2015-12-21 NOTE — Patient Instructions (Signed)

## 2015-12-21 NOTE — Progress Notes (Signed)
    Dorothy Sharp [birth year] [id number]        60 y.o.  [id number]  for annual exam.  Doing well without gynecologic complaints  Past medical history,surgical history, problem list, medications, allergies, family history and social history were all reviewed and documented as reviewed in the EPIC chart.  ROS:  Performed with pertinent positives and negatives included in the history, assessment and plan.   Additional significant findings :  None   Exam: Caryn Bee assistant Vitals:   12/21/15 0850  BP: 120/74  Weight: 132 lb (59.9 kg)  Height: 5' 5.5" (1.664 m)   Body mass index is 21.63 kg/m.  General appearance:  Normal affect, orientation and appearance. Skin: Grossly normal HEENT: Without gross lesions.  No cervical or supraclavicular adenopathy. Thyroid normal.  Lungs:  Clear without wheezing, rales or rhonchi Cardiac: RR, without RMG Abdominal:  Soft, nontender, without masses, guarding, rebound, organomegaly or hernia Breasts:  Examined lying and sitting without masses, retractions, discharge or axillary adenopathy. Pelvic:  Ext, BUS, Vagina with atrophic changes  Cervix with atrophic changes  Uterus anteverted, normal size, shape and contour, midline and mobile nontender   Adnexa without masses or tenderness    Anus and perineum normal   Rectovaginal normal sphincter tone without palpated masses or tenderness.    Assessment/Plan:  60 y.o. [id number] female for annual exam.   1. Postmenopausal/atrophic changes. Doing well without significant hot flushes, night sweats, vaginal dryness or any vaginal bleeding. Continue to monitor report any issues or vaginal bleeding. 2. Mammography 10/2015. Continue with annual mammography when due. SBE monthly reviewed. 3. Colonoscopy 2011 with reported repeat interval 10 years. 4. DEXA 2010 normal. Plan repeat DEXA at age 60. 51. Pap smear/HPV 2015. No Pap smear done today. No history of significant abnormal Pap smears. Plan repeat Pap  smear at 5 year interval per current screening guidelines. 6. History of leiomyoma on ultrasound. Exam normal. Will monitor with annual exams. 7. Health maintenance. No routine lab work done as patient does this elsewhere. Follow up 1 year, sooner as needed.   Anastasio Auerbach MD, 9:17 AM 12/21/2015

## 2016-03-02 ENCOUNTER — Other Ambulatory Visit: Payer: Self-pay | Admitting: Gynecology

## 2016-06-04 ENCOUNTER — Other Ambulatory Visit: Payer: Self-pay | Admitting: Gynecology

## 2016-09-02 ENCOUNTER — Other Ambulatory Visit: Payer: Self-pay | Admitting: Gynecology

## 2016-09-28 ENCOUNTER — Other Ambulatory Visit: Payer: Self-pay | Admitting: Gynecology

## 2016-11-07 ENCOUNTER — Other Ambulatory Visit: Payer: Self-pay | Admitting: Gynecology

## 2016-11-07 DIAGNOSIS — Z1231 Encounter for screening mammogram for malignant neoplasm of breast: Secondary | ICD-10-CM

## 2016-11-12 ENCOUNTER — Other Ambulatory Visit: Payer: Self-pay | Admitting: Gynecology

## 2016-11-21 ENCOUNTER — Ambulatory Visit
Admission: RE | Admit: 2016-11-21 | Discharge: 2016-11-21 | Disposition: A | Discharge status: Home / Self Care | Payer: PRIVATE HEALTH INSURANCE | Source: Ambulatory Visit | Attending: Gynecology | Admitting: Gynecology

## 2016-11-21 DIAGNOSIS — Z1231 Encounter for screening mammogram for malignant neoplasm of breast: Secondary | ICD-10-CM

## 2016-12-26 ENCOUNTER — Ambulatory Visit (INDEPENDENT_AMBULATORY_CARE_PROVIDER_SITE_OTHER): Payer: PRIVATE HEALTH INSURANCE | Admitting: Gynecology

## 2016-12-26 ENCOUNTER — Encounter: Payer: Self-pay | Admitting: Gynecology

## 2016-12-26 VITALS — BP 120/74 | Ht 65.0 in | Wt 131.0 lb

## 2016-12-26 DIAGNOSIS — Z01411 Encounter for gynecological examination (general) (routine) with abnormal findings: Secondary | ICD-10-CM

## 2016-12-26 DIAGNOSIS — N952 Postmenopausal atrophic vaginitis: Secondary | ICD-10-CM

## 2016-12-26 NOTE — Patient Instructions (Signed)
Follow-up in 1 year, sooner as needed. 

## 2016-12-26 NOTE — Progress Notes (Signed)
    Dorothy Sharp Feb 15, 1955 [id number]        60 y.o.  [id number] for annual gynecologic exam.  Doing well without complaints.  Past medical history,surgical history, problem list, medications, allergies, family history and social history were all reviewed and documented as reviewed in the EPIC chart.  ROS:  Performed with pertinent positives and negatives included in the history, assessment and plan.   Additional significant findings : None   Exam: Caryn Bee assistant Vitals:   12/26/16 1359  BP: 120/74  Weight: 131 lb (59.4 kg)  Height: 5\' 5"  (1.651 m)   Body mass index is 21.8 kg/m.  General appearance:  Normal affect, orientation and appearance. Skin: Grossly normal HEENT: Without gross lesions.  No cervical or supraclavicular adenopathy. Thyroid normal.  Lungs:  Clear without wheezing, rales or rhonchi Cardiac: RR, without RMG Abdominal:  Soft, nontender, without masses, guarding, rebound, organomegaly or hernia Breasts:  Examined lying and sitting without masses, retractions, discharge or axillary adenopathy. Pelvic:  Ext, BUS, Vagina: Normal with atrophic changes  Cervix: Normal with atrophic changes  Uterus: Anteverted, normal size, shape and contour, midline and mobile nontender   Adnexa: Without masses or tenderness    Anus and perineum: Normal   Rectovaginal: Normal sphincter tone without palpated masses or tenderness.    Assessment/Plan:  61 y.o. [id number] female for annual gynecologic exam.   1. Postmenopausal/atrophic genital changes.  No significant hot flushes, night sweats, vaginal dryness or any vaginal bleeding.  Continue to monitor and report any issues or bleeding. 2. Mammography 11/2016.  Continue with annual mammography next year.  Breast exam normal today.  SBE monthly reviewed. 3. DEXA 2010 normal.  Plan repeat DEXA at age 50. 58. Colonoscopy 2011.  Repeat at their recommended interval. 5. Pap smear/HPV 2015.  No Pap smear done today.  No history  of abnormal Pap smears.  Plan repeat Pap smear at 5-year interval per current screening guidelines. 6. Health maintenance.  No routine lab work done.  She brought copies from her other physician.  Labs were all normal with the exception of a minimally elevated cholesterol and LDL but an HDL of 100.  Follow-up in 1 year, sooner as needed.   Anastasio Auerbach MD, 2:21 PM 12/26/2016

## 2017-04-02 ENCOUNTER — Other Ambulatory Visit: Payer: Self-pay | Admitting: Gynecology

## 2017-10-30 ENCOUNTER — Other Ambulatory Visit: Payer: Self-pay | Admitting: Gynecology

## 2017-10-30 DIAGNOSIS — Z1231 Encounter for screening mammogram for malignant neoplasm of breast: Secondary | ICD-10-CM

## 2017-11-27 ENCOUNTER — Ambulatory Visit: Payer: PRIVATE HEALTH INSURANCE

## 2017-12-25 ENCOUNTER — Ambulatory Visit: Payer: PRIVATE HEALTH INSURANCE

## 2018-01-01 ENCOUNTER — Ambulatory Visit
Admission: RE | Admit: 2018-01-01 | Discharge: 2018-01-01 | Disposition: A | Discharge status: Home / Self Care | Payer: PRIVATE HEALTH INSURANCE | Source: Ambulatory Visit | Attending: Gynecology | Admitting: Gynecology

## 2018-01-01 ENCOUNTER — Encounter: Payer: Self-pay | Admitting: Gynecology

## 2018-01-01 DIAGNOSIS — Z1231 Encounter for screening mammogram for malignant neoplasm of breast: Secondary | ICD-10-CM

## 2018-01-02 ENCOUNTER — Ambulatory Visit: Payer: No Typology Code available for payment source | Admitting: Gynecology

## 2018-01-02 ENCOUNTER — Encounter: Payer: Self-pay | Admitting: Gynecology

## 2018-01-02 VITALS — BP 118/76 | Ht 65.0 in | Wt 132.0 lb

## 2018-01-02 DIAGNOSIS — Z01419 Encounter for gynecological examination (general) (routine) without abnormal findings: Secondary | ICD-10-CM

## 2018-01-02 DIAGNOSIS — N952 Postmenopausal atrophic vaginitis: Secondary | ICD-10-CM | POA: Diagnosis not present

## 2018-01-02 DIAGNOSIS — Z1151 Encounter for screening for human papillomavirus (HPV): Secondary | ICD-10-CM

## 2018-01-02 NOTE — Progress Notes (Signed)
    Dorothy Sharp Jul 20, 1955 [id number]        62 y.o.  [id number] for annual gynecologic exam.  Without gynecologic complaints  Past medical history,surgical history, problem list, medications, allergies, family history and social history were all reviewed and documented as reviewed in the EPIC chart.  ROS:  Performed with pertinent positives and negatives included in the history, assessment and plan.   Additional significant findings : None   Exam: Caryn Bee assistant Vitals:   01/02/18 1205  BP: 118/76  Weight: 132 lb (59.9 kg)  Height: 5\' 5"  (1.651 m)   Body mass index is 21.97 kg/m.  General appearance:  Normal affect, orientation and appearance. Skin: Grossly normal HEENT: Without gross lesions.  No cervical or supraclavicular adenopathy. Thyroid normal.  Lungs:  Clear without wheezing, rales or rhonchi Cardiac: RR, without RMG Abdominal:  Soft, nontender, without masses, guarding, rebound, organomegaly or hernia Breasts:  Examined lying and sitting without masses, retractions, discharge or axillary adenopathy. Pelvic:  Ext, BUS, Vagina: Normal with atrophic changes  Cervix: Normal with atrophic changes Pap smear/HPV  Uterus: Anteverted, normal size, shape and contour, midline and mobile nontender   Adnexa: Without masses or tenderness    Anus and perineum: Normal   Rectovaginal: Normal sphincter tone without palpated masses or tenderness.    Assessment/Plan:  62 y.o. [id number] female for annual gynecologic exam.   1. Postmenopausal/atrophic genital changes.  No significant menopausal symptoms or any bleeding. 2. Mammography yesterday.  Breast exam normal today. 3. DEXA 2010 normal.  Plan repeat DEXA next year as she turns 76. 4. Colonoscopy 2011.  Repeat at their recommended interval. 5. Pap smear/HPV 2015.  Pap smear/HPV today.  No history of abnormal Pap smears previously. 6. Health maintenance.  No routine lab work done as patient does this elsewhere.   Follow-up 1 year, sooner as needed.   Anastasio Auerbach MD, 12:27 PM 01/02/2018

## 2018-01-02 NOTE — Addendum Note (Signed)
Addended by: Nelva Nay on: 01/02/2018 12:43 PM   Modules accepted: Orders

## 2018-01-02 NOTE — Patient Instructions (Signed)
Follow-up in 1 year for annual exam, sooner as needed. 

## 2018-01-04 LAB — PAP IG AND HPV HIGH-RISK: HPV DNA HIGH RISK: NOT DETECTED

## 2018-11-13 ENCOUNTER — Encounter: Payer: Self-pay | Admitting: Gynecology

## 2018-12-03 ENCOUNTER — Other Ambulatory Visit: Payer: Self-pay | Admitting: Gynecology

## 2018-12-03 DIAGNOSIS — Z1231 Encounter for screening mammogram for malignant neoplasm of breast: Secondary | ICD-10-CM

## 2019-01-04 ENCOUNTER — Other Ambulatory Visit: Payer: Self-pay

## 2019-01-07 ENCOUNTER — Ambulatory Visit (INDEPENDENT_AMBULATORY_CARE_PROVIDER_SITE_OTHER): Payer: No Typology Code available for payment source | Admitting: Gynecology

## 2019-01-07 ENCOUNTER — Other Ambulatory Visit: Payer: Self-pay

## 2019-01-07 ENCOUNTER — Encounter: Payer: Self-pay | Admitting: Gynecology

## 2019-01-07 VITALS — BP 118/76 | Ht 65.0 in | Wt 126.0 lb

## 2019-01-07 DIAGNOSIS — N952 Postmenopausal atrophic vaginitis: Secondary | ICD-10-CM | POA: Diagnosis not present

## 2019-01-07 DIAGNOSIS — Z01419 Encounter for gynecological examination (general) (routine) without abnormal findings: Secondary | ICD-10-CM

## 2019-01-07 NOTE — Progress Notes (Signed)
    Sherria Calogero Oct 16, 1955 [id number]        63 y.o.  [id number] for annual gynecologic exam.  Without gynecologic complaints  Past medical history,surgical history, problem list, medications, allergies, family history and social history were all reviewed and documented as reviewed in the EPIC chart.  ROS:  Performed with pertinent positives and negatives included in the history, assessment and plan.   Additional significant findings : None   Exam: Caryn Bee assistant Vitals:   01/07/19 1541  BP: 118/76  Weight: 126 lb (57.2 kg)  Height: 5\' 5"  (1.651 m)   Body mass index is 20.97 kg/m.  General appearance:  Normal affect, orientation and appearance. Skin: Grossly normal HEENT: Without gross lesions.  No cervical or supraclavicular adenopathy. Thyroid normal.  Lungs:  Clear without wheezing, rales or rhonchi Cardiac: RR, without RMG Abdominal:  Soft, nontender, without masses, guarding, rebound, organomegaly or hernia Breasts:  Examined lying and sitting without masses, retractions, discharge or axillary adenopathy. Pelvic:  Ext, BUS, Vagina: With atrophic changes  Cervix: With atrophic changes  Uterus: Anteverted, normal size, shape and contour, midline and mobile nontender   Adnexa: Without masses or tenderness    Anus and perineum: Normal   Rectovaginal: Normal sphincter tone without palpated masses or tenderness.    Assessment/Plan:  63 y.o. [id number] female for annual gynecologic exam.   1. Postmenopausal.  No significant menopausal symptoms or any vaginal bleeding. 2. Mammography scheduled in January.  Breast exam normal today. 3. Colonoscopy coming due next year and I reminded her to schedule this. 4. DEXA 2010 normal.  Recommend follow-up DEXA now at age 48.  Patient agrees to schedule and follow-up for this. 5. Pap smear/HPV 2019.  No Pap smear done today.  No history of abnormal Pap smears.  Plan repeat Pap smear/HPV at 5-year interval per current screening  guidelines. 6. Health maintenance.  No routine lab work done as patient does this elsewhere.  Follow-up 1 year, sooner as needed.   Anastasio Auerbach MD, 4:03 PM 01/07/2019

## 2019-01-07 NOTE — Patient Instructions (Addendum)
Follow-up for the bone density as scheduled.  Follow-up in 1 year for annual exam

## 2019-02-18 ENCOUNTER — Ambulatory Visit
Admission: RE | Admit: 2019-02-18 | Discharge: 2019-02-18 | Disposition: A | Discharge status: Home / Self Care | Payer: No Typology Code available for payment source | Source: Ambulatory Visit | Attending: Gynecology | Admitting: Gynecology

## 2019-02-18 ENCOUNTER — Other Ambulatory Visit: Payer: Self-pay

## 2019-02-18 DIAGNOSIS — Z1231 Encounter for screening mammogram for malignant neoplasm of breast: Secondary | ICD-10-CM

## 2019-02-20 ENCOUNTER — Other Ambulatory Visit: Payer: Self-pay

## 2019-02-21 ENCOUNTER — Other Ambulatory Visit: Payer: Self-pay | Admitting: Gynecology

## 2019-02-21 ENCOUNTER — Ambulatory Visit (INDEPENDENT_AMBULATORY_CARE_PROVIDER_SITE_OTHER): Payer: No Typology Code available for payment source

## 2019-02-21 ENCOUNTER — Other Ambulatory Visit: Payer: Self-pay | Admitting: Obstetrics & Gynecology

## 2019-02-21 DIAGNOSIS — Z780 Asymptomatic menopausal state: Secondary | ICD-10-CM | POA: Diagnosis not present

## 2019-02-21 DIAGNOSIS — M8589 Other specified disorders of bone density and structure, multiple sites: Secondary | ICD-10-CM | POA: Diagnosis not present

## 2019-02-21 DIAGNOSIS — Z01419 Encounter for gynecological examination (general) (routine) without abnormal findings: Secondary | ICD-10-CM

## 2020-02-24 ENCOUNTER — Encounter: Payer: No Typology Code available for payment source | Admitting: Obstetrics and Gynecology

## 2020-03-04 ENCOUNTER — Other Ambulatory Visit: Payer: Self-pay | Admitting: Nurse Practitioner

## 2020-03-04 DIAGNOSIS — Z1231 Encounter for screening mammogram for malignant neoplasm of breast: Secondary | ICD-10-CM

## 2020-03-05 ENCOUNTER — Ambulatory Visit: Payer: No Typology Code available for payment source

## 2020-03-05 NOTE — Progress Notes (Signed)
65 y.o. [id number] Married White or Caucasian female here for annual exam.      Denies problems today Denies vaginal bleeding Dexa done last year shows Osteopenia -2.1 Works as a Nurse, learning disability at [employer]   Patient's last menstrual period was 04/18/2014.          Sexually active: Yes.    The current method of family planning is post menopausal status.    Exercising: Yes.    walking, pilates Smoker:  no  Health Maintenance: Pap:  01-02-18 neg HPV HR neg History of abnormal Pap:  no MMG:  02-18-2019 category c density birads 1:neg Colonoscopy:  01-06-2020 neg per patient BMD:   02-21-2019 TDaP:  2013 Gardasil:   n/a Covid-19: pfizer Hep C testing: not done Screening Labs: mostly with PCP, will check Vit D today   reports that she has never smoked. She has never used smokeless tobacco. She reports current alcohol use of about 2.0 standard drinks of alcohol per week. She reports that she does not use drugs.  Past Medical History:  Diagnosis Date  . Fibroid   . Migraines   . Tendonitis   . Vertigo     Past Surgical History:  Procedure Laterality Date  . ENDOMETRIAL ABLATION  2005  . HYSTEROSCOPY  X 2   RESECTION OF MYOMA X 2  . TONSILLECTOMY  1970    Current Outpatient Medications  Medication Sig Dispense Refill  . BIOTIN PO Take by mouth.    Marland Kitchen BLACK COHOSH PO Take by mouth.    . Coenzyme Q10 (CO Q 10 PO) Take by mouth.    Marland Kitchen glucosamine-chondroitin 500-400 MG tablet Take 1 tablet by mouth daily.    Marland Kitchen KRILL OIL PO Take by mouth.    . levocetirizine (XYZAL) 5 MG tablet TAKE 1 TABLET BY MOUTH ONCE DAILY 90 tablet 0  . Multiple Vitamin (MULTIVITAMIN) tablet Take 1 tablet by mouth daily.    . Pseudoephedrine HCl (SUDAFED PO) Take by mouth.     No current facility-administered medications for this visit.    Family History  Problem Relation Age of Onset  . Hypertension Mother   . Hypertension Father   . Cancer Maternal Grandmother        COLON  CANCER    Review of Systems  Constitutional: Negative.   HENT: Negative.   Eyes: Negative.   Respiratory: Negative.   Cardiovascular: Negative.   Gastrointestinal: Negative.   Endocrine: Negative.   Genitourinary: Negative.   Musculoskeletal: Negative.   Skin: Negative.   Allergic/Immunologic: Negative.   Neurological: Negative.   Hematological: Negative.   Psychiatric/Behavioral: Negative.     Exam:   BP 110/70   Pulse 70   Resp 16   Ht 5' 5.25" (1.657 m)   Wt 126 lb (57.2 kg)   LMP 04/18/2014   BMI 20.81 kg/m   Height: 5' 5.25" (165.7 cm)  General appearance: alert, cooperative and appears stated age, no acute distress Head: Normocephalic, without obvious abnormality Neck: no adenopathy, thyroid normal to inspection and palpation Lungs: clear to auscultation bilaterally Breasts: No axillary or supraclavicular adenopathy, Normal to palpation without dominant masses Heart: regular rate and rhythm Abdomen: soft, non-tender; no masses,  no organomegaly Extremities: extremities normal, no edema Skin: No rashes or lesions Lymph nodes: Cervical, supraclavicular, and axillary nodes normal. No abnormal inguinal nodes palpated Neurologic: Grossly normal   Pelvic: External genitalia:  no lesions  Urethra:  normal appearing urethra with no masses, tenderness or lesions              Bartholins and Skenes: normal                 Vagina: normal appearing vagina, appropriate for age, normal appearing discharge, no lesions              Cervix: neg cervical motion tenderness, no visible lesions             Bimanual Exam:   Uterus:  normal size, contour, position, consistency, mobility, non-tender              Adnexa: no mass, fullness, tenderness   Rectal: confirms above, no mass                 Joy, CMA Chaperone was present for exam.   A: Well woman exam with routine gynecological exam  Osteopenia after menopause - Plan: VITAMIN D 25 Hydroxy (Vit-D  Deficiency, Fractures)  P:   Pap :cotesting done 2019, due next 2024  Mammogram: pt will schedule  Labs: vitamin D drawn today, otherwise get screening lab with pcp  Medications: recommended Calcium 1200-1500mg , vit D 800 IU  Dexa scan due 1 year

## 2020-03-09 ENCOUNTER — Encounter: Payer: No Typology Code available for payment source | Admitting: Obstetrics and Gynecology

## 2020-03-09 ENCOUNTER — Ambulatory Visit (INDEPENDENT_AMBULATORY_CARE_PROVIDER_SITE_OTHER): Payer: No Typology Code available for payment source | Admitting: Nurse Practitioner

## 2020-03-09 ENCOUNTER — Encounter: Payer: Self-pay | Admitting: Nurse Practitioner

## 2020-03-09 ENCOUNTER — Other Ambulatory Visit: Payer: Self-pay

## 2020-03-09 VITALS — BP 110/70 | HR 70 | Resp 16 | Ht 65.25 in | Wt 126.0 lb

## 2020-03-09 DIAGNOSIS — Z01419 Encounter for gynecological examination (general) (routine) without abnormal findings: Secondary | ICD-10-CM | POA: Diagnosis not present

## 2020-03-09 DIAGNOSIS — M8580 Other specified disorders of bone density and structure, unspecified site: Secondary | ICD-10-CM | POA: Diagnosis not present

## 2020-03-09 DIAGNOSIS — Z780 Asymptomatic menopausal state: Secondary | ICD-10-CM

## 2020-03-09 NOTE — Patient Instructions (Signed)
Vitamin D [phone] IU/ Calcium 1200-1500 mg   Health Maintenance for Postmenopausal Women Menopause is a normal process in which your ability to get pregnant comes to an end. This process happens slowly over many months or years, usually between the ages of 36 and 52. Menopause is complete when you have missed your menstrual periods for 12 months. It is important to talk with your health care provider about some of the most common conditions that affect women after menopause (postmenopausal women). These include heart disease, cancer, and bone loss (osteoporosis). Adopting a healthy lifestyle and getting preventive care can help to promote your health and wellness. The actions you take can also lower your chances of developing some of these common conditions. What should I know about menopause? During menopause, you may get a number of symptoms, such as:  Hot flashes. These can be moderate or severe.  Night sweats.  Decrease in sex drive.  Mood swings.  Headaches.  Tiredness.  Irritability.  Memory problems.  Insomnia. Choosing to treat or not to treat these symptoms is a decision that you make with your health care provider. Do I need hormone replacement therapy?  Hormone replacement therapy is effective in treating symptoms that are caused by menopause, such as hot flashes and night sweats.  Hormone replacement carries certain risks, especially as you become older. If you are thinking about using estrogen or estrogen with progestin, discuss the benefits and risks with your health care provider. What is my risk for heart disease and stroke? The risk of heart disease, heart attack, and stroke increases as you age. One of the causes may be a change in the body's hormones during menopause. This can affect how your body uses dietary fats, triglycerides, and cholesterol. Heart attack and stroke are medical emergencies. There are many things that you can do to help prevent heart disease  and stroke. Watch your blood pressure  High blood pressure causes heart disease and increases the risk of stroke. This is more likely to develop in people who have high blood pressure readings, are of African descent, or are overweight.  Have your blood pressure checked: ? Every 3-5 years if you are 55-19 years of age. ? Every year if you are 25 years old or older. Eat a healthy diet  Eat a diet that includes plenty of vegetables, fruits, low-fat dairy products, and lean protein.  Do not eat a lot of foods that are high in solid fats, added sugars, or sodium.   Get regular exercise Get regular exercise. This is one of the most important things you can do for your health. Most adults should:  Try to exercise for at least 150 minutes each week. The exercise should increase your heart rate and make you sweat (moderate-intensity exercise).  Try to do strengthening exercises at least twice each week. Do these in addition to the moderate-intensity exercise.  Spend less time sitting. Even light physical activity can be beneficial. Other tips  Work with your health care provider to achieve or maintain a healthy weight.  Do not use any products that contain nicotine or tobacco, such as cigarettes, e-cigarettes, and chewing tobacco. If you need help quitting, ask your health care provider.  Know your numbers. Ask your health care provider to check your cholesterol and your blood sugar (glucose). Continue to have your blood tested as directed by your health care provider. Do I need screening for cancer? Depending on your health history and family history, you may need to  have cancer screening at different stages of your life. This may include screening for:  Breast cancer.  Cervical cancer.  Lung cancer.  Colorectal cancer. What is my risk for osteoporosis? After menopause, you may be at increased risk for osteoporosis. Osteoporosis is a condition in which bone destruction happens more  quickly than new bone creation. To help prevent osteoporosis or the bone fractures that can happen because of osteoporosis, you may take the following actions:  If you are 74-63 years old, get at least 1,000 mg of calcium and at least 600 mg of vitamin D per day.  If you are older than age 46 but younger than age 17, get at least 1,200 mg of calcium and at least 600 mg of vitamin D per day.  If you are older than age 80, get at least 1,200 mg of calcium and at least 800 mg of vitamin D per day. Smoking and drinking excessive alcohol increase the risk of osteoporosis. Eat foods that are rich in calcium and vitamin D, and do weight-bearing exercises several times each week as directed by your health care provider. How does menopause affect my mental health? Depression may occur at any age, but it is more common as you become older. Common symptoms of depression include:  Low or sad mood.  Changes in sleep patterns.  Changes in appetite or eating patterns.  Feeling an overall lack of motivation or enjoyment of activities that you previously enjoyed.  Frequent crying spells. Talk with your health care provider if you think that you are experiencing depression. General instructions See your health care provider for regular wellness exams and vaccines. This may include:  Scheduling regular health, dental, and eye exams.  Getting and maintaining your vaccines. These include: ? Influenza vaccine. Get this vaccine each year before the flu season begins. ? Pneumonia vaccine. ? Shingles vaccine. ? Tetanus, diphtheria, and pertussis (Tdap) booster vaccine. Your health care provider may also recommend other immunizations. Tell your health care provider if you have ever been abused or do not feel safe at home. Summary  Menopause is a normal process in which your ability to get pregnant comes to an end.  This condition causes hot flashes, night sweats, decreased interest in sex, mood swings,  headaches, or lack of sleep.  Treatment for this condition may include hormone replacement therapy.  Take actions to keep yourself healthy, including exercising regularly, eating a healthy diet, watching your weight, and checking your blood pressure and blood sugar levels.  Get screened for cancer and depression. Make sure that you are up to date with all your vaccines. This information is not intended to replace advice given to you by your health care provider. Make sure you discuss any questions you have with your health care provider. Document Revised: 01/24/2018 Document Reviewed: 01/24/2018 Elsevier Patient Education  2021 [employer].

## 2020-03-10 LAB — VITAMIN D 25 HYDROXY (VIT D DEFICIENCY, FRACTURES): Vit D, 25-Hydroxy: 38 ng/mL (ref 30–100)

## 2020-04-16 ENCOUNTER — Other Ambulatory Visit: Payer: Self-pay

## 2020-04-16 ENCOUNTER — Ambulatory Visit (INDEPENDENT_AMBULATORY_CARE_PROVIDER_SITE_OTHER): Payer: No Typology Code available for payment source

## 2020-04-16 DIAGNOSIS — Z1231 Encounter for screening mammogram for malignant neoplasm of breast: Secondary | ICD-10-CM | POA: Diagnosis not present

## 2021-03-22 ENCOUNTER — Ambulatory Visit: Payer: No Typology Code available for payment source | Admitting: Nurse Practitioner

## 2021-04-05 ENCOUNTER — Other Ambulatory Visit: Payer: Self-pay | Admitting: Nurse Practitioner

## 2021-04-05 DIAGNOSIS — Z1231 Encounter for screening mammogram for malignant neoplasm of breast: Secondary | ICD-10-CM

## 2021-04-22 ENCOUNTER — Ambulatory Visit (INDEPENDENT_AMBULATORY_CARE_PROVIDER_SITE_OTHER): Payer: No Typology Code available for payment source

## 2021-04-22 ENCOUNTER — Other Ambulatory Visit: Payer: Self-pay

## 2021-04-22 DIAGNOSIS — Z1231 Encounter for screening mammogram for malignant neoplasm of breast: Secondary | ICD-10-CM | POA: Diagnosis not present

## 2021-05-12 ENCOUNTER — Other Ambulatory Visit: Payer: Self-pay | Admitting: Physician Assistant

## 2021-05-12 DIAGNOSIS — M8580 Other specified disorders of bone density and structure, unspecified site: Secondary | ICD-10-CM

## 2021-11-15 ENCOUNTER — Other Ambulatory Visit: Payer: No Typology Code available for payment source

## 2022-03-22 ENCOUNTER — Other Ambulatory Visit: Payer: Self-pay | Admitting: Physician Assistant

## 2022-03-22 DIAGNOSIS — Z1231 Encounter for screening mammogram for malignant neoplasm of breast: Secondary | ICD-10-CM

## 2022-05-04 ENCOUNTER — Ambulatory Visit (INDEPENDENT_AMBULATORY_CARE_PROVIDER_SITE_OTHER): Payer: No Typology Code available for payment source

## 2022-05-04 DIAGNOSIS — Z1231 Encounter for screening mammogram for malignant neoplasm of breast: Secondary | ICD-10-CM | POA: Diagnosis not present

## 2023-04-03 ENCOUNTER — Other Ambulatory Visit: Payer: Self-pay | Admitting: Physician Assistant

## 2023-04-03 DIAGNOSIS — Z1231 Encounter for screening mammogram for malignant neoplasm of breast: Secondary | ICD-10-CM

## 2023-05-24 ENCOUNTER — Ambulatory Visit: Payer: BC Managed Care – PPO

## 2023-06-15 ENCOUNTER — Ambulatory Visit

## 2024-01-03 IMAGING — MG MM DIGITAL SCREENING BILAT W/ TOMO AND CAD
8 series · 9 of 24 positions shown · non-contrast
Comparison: Previous exam(s).

CLINICAL DATA: Screening.

EXAM:
DIGITAL SCREENING BILATERAL MAMMOGRAM WITH TOMOSYNTHESIS AND CAD
TECHNIQUE: Bilateral screening digital craniocaudal and mediolateral oblique
mammograms were obtained. Bilateral screening digital breast
tomosynthesis was performed. The images were evaluated with
computer-aided detection.

[R CC synth-2D]
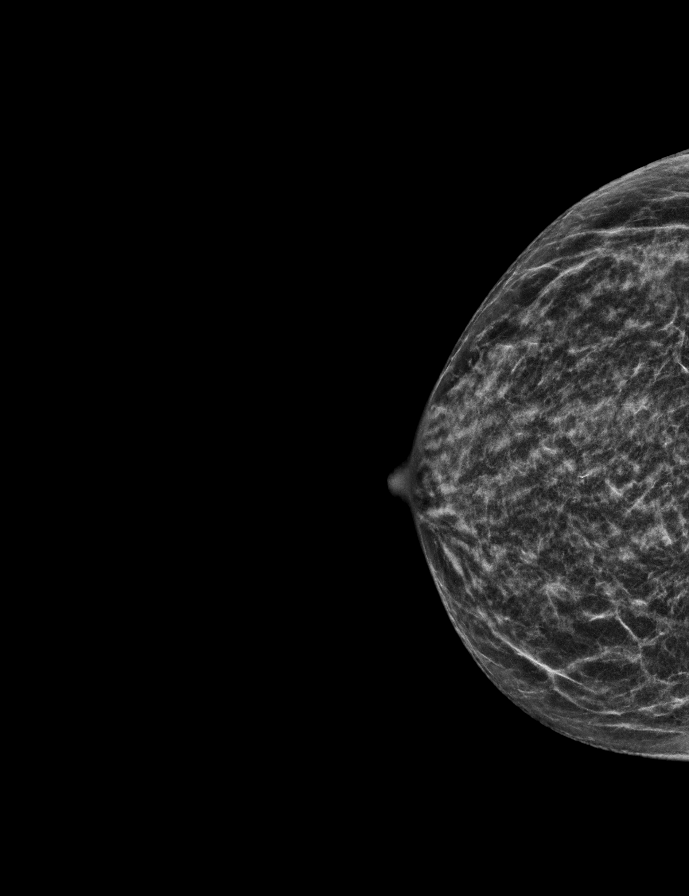

[R MLO synth-2D]
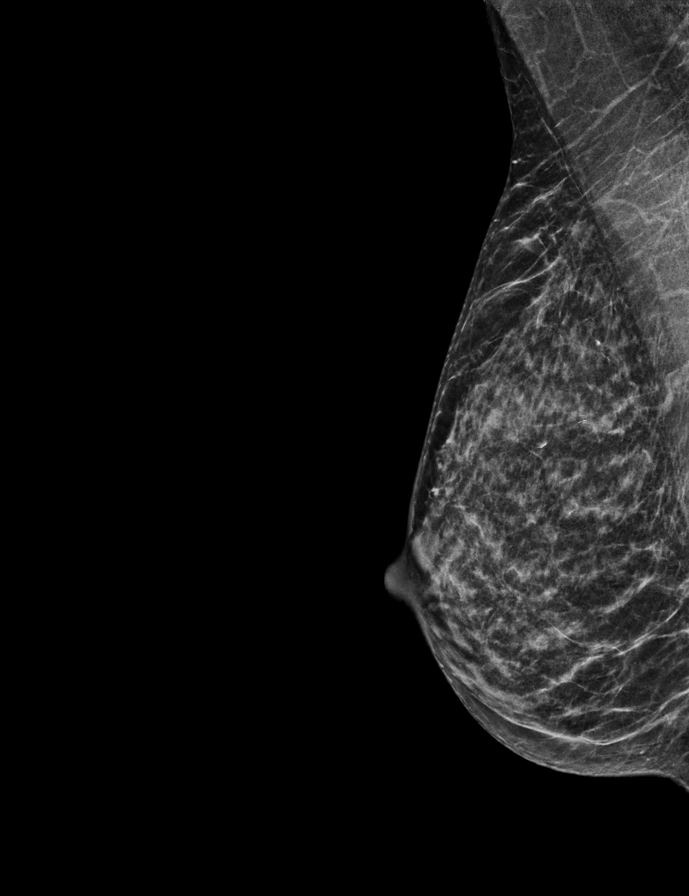

[L MLO synth-2D]
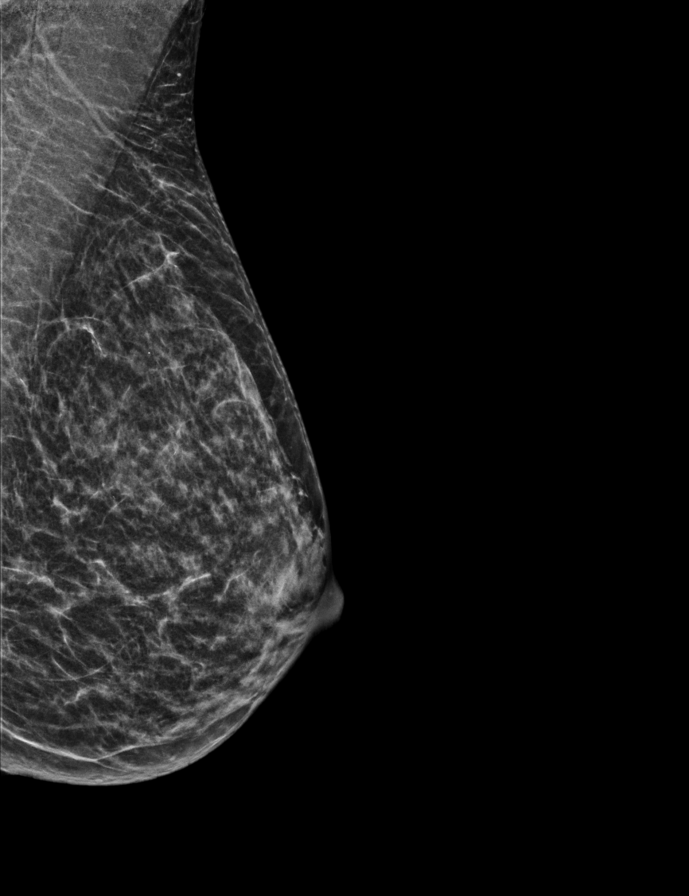

[L CC synth-2D]
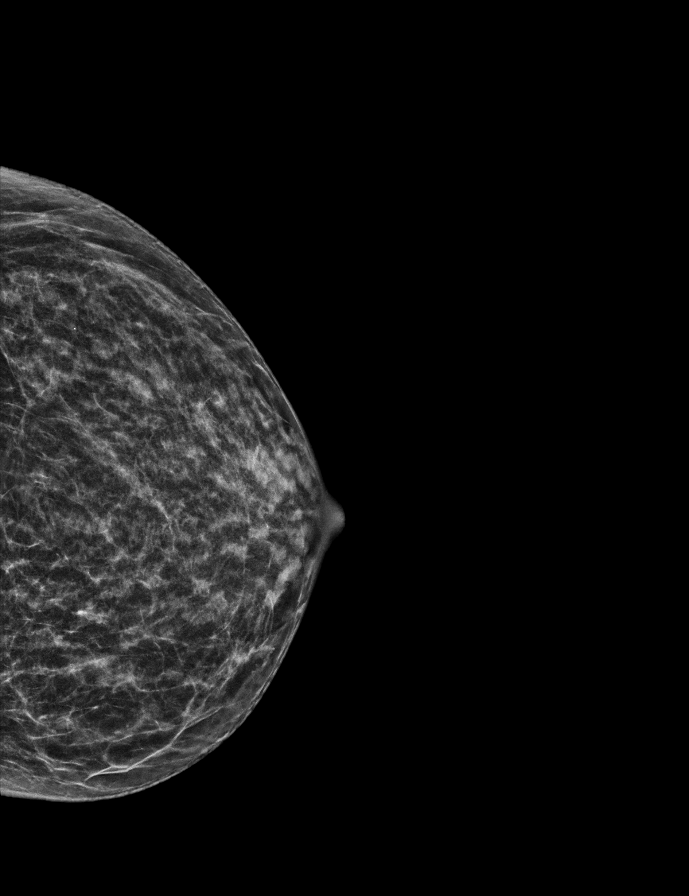

[L MLO tomo · 2 of 40 frames shown]
[frame 13/40]
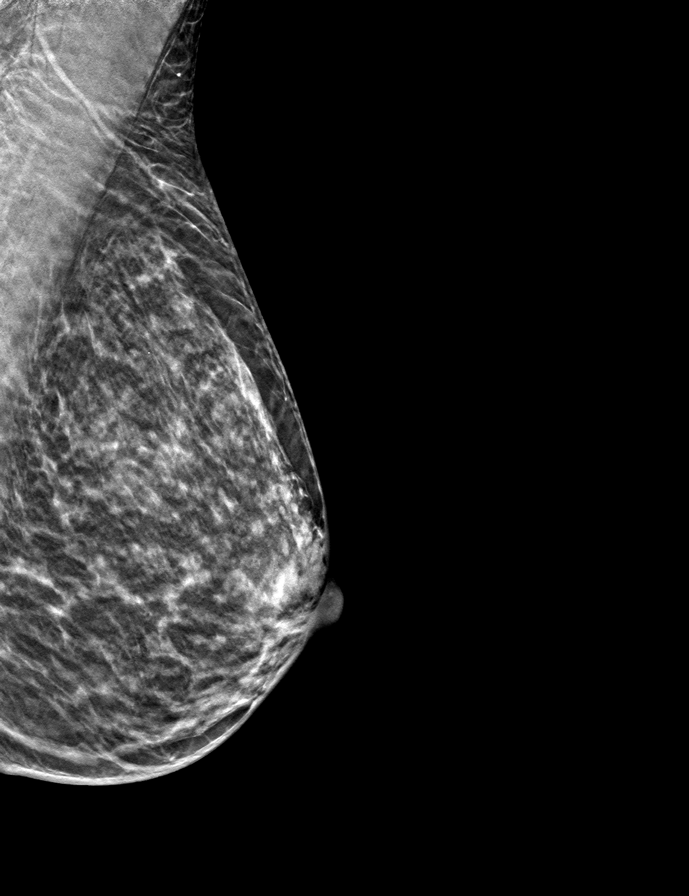
[frame 21/40]
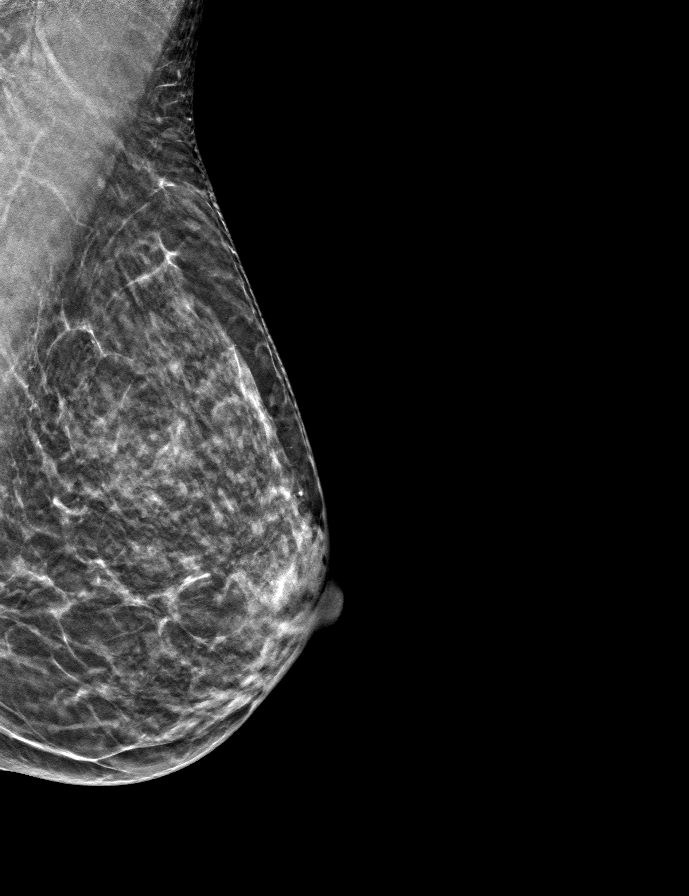

[R CC tomo · tomo slice 21/41.0]
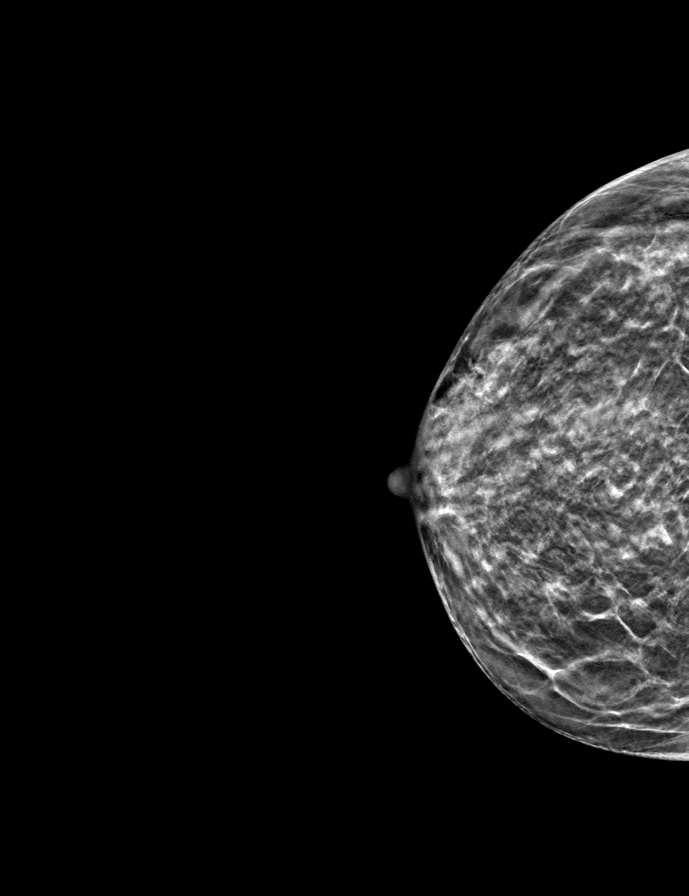

[L CC tomo · tomo slice 21/40.0]
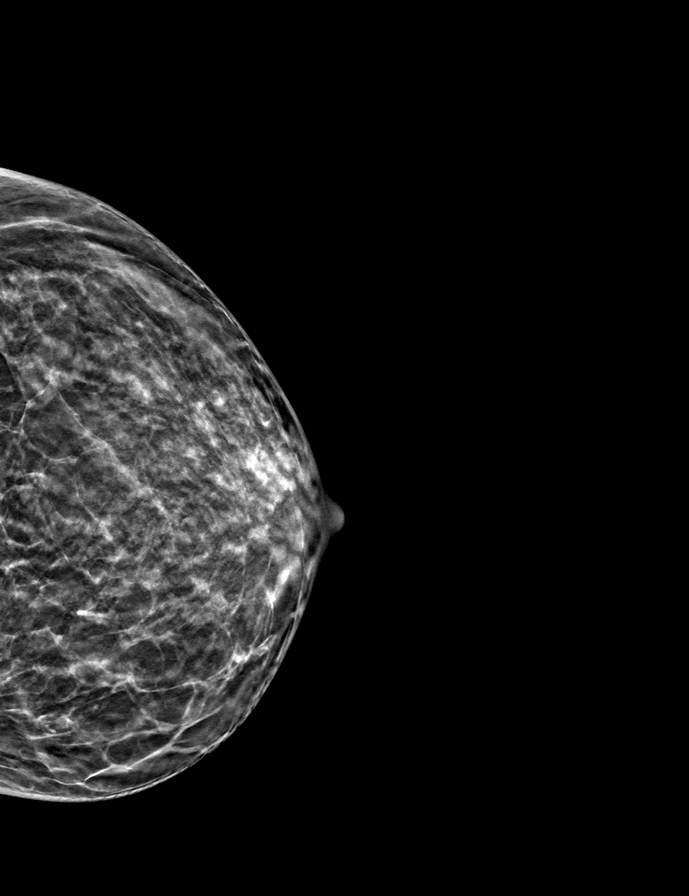

[R MLO tomo · tomo slice 21/40.0]
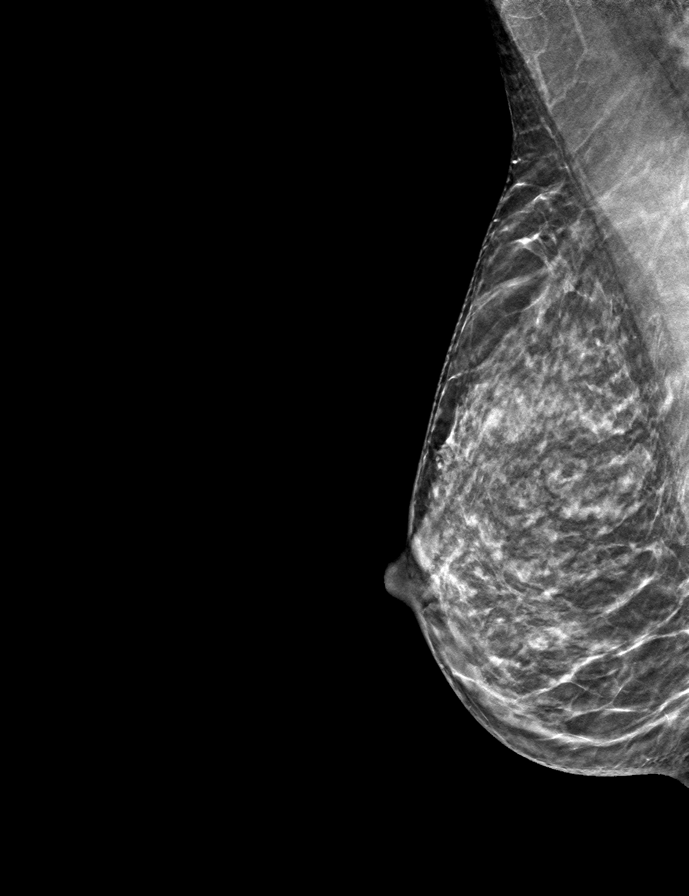

[9 of 24 positions shown; findings below may reference images not displayed]

ACR Breast Density Category c: The breast tissue is heterogeneously
dense, which may obscure small masses.
FINDINGS: There are no findings suspicious for malignancy.
IMPRESSION: No mammographic evidence of malignancy. A result letter of this
screening mammogram will be mailed directly to the patient.

RECOMMENDATION:
Screening mammogram in one year. (Code:Q3-W-BC3)

BI-RADS CATEGORY  1: Negative.
